# Patient Record
Sex: Female | Born: 2000 | Race: Black or African American | Hispanic: No | Marital: Single | Smoking: Never smoker
Health system: Southern US, Community
[De-identification: ages and names within clinical notes are randomized; demographics above are authoritative.]

## PROBLEM LIST (undated history)

## (undated) ENCOUNTER — Inpatient Hospital Stay (HOSPITAL_COMMUNITY): Payer: Self-pay

## (undated) DIAGNOSIS — J302 Other seasonal allergic rhinitis: Secondary | ICD-10-CM

## (undated) DIAGNOSIS — F909 Attention-deficit hyperactivity disorder, unspecified type: Secondary | ICD-10-CM

## (undated) HISTORY — PX: NO PAST SURGERIES: SHX2092

---
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
## 2022-05-10 IMAGING — CT CT MAXILLOFACIAL W/O CM
3 of 6 series · 15 of 47 positions shown, 18 images · non-contrast
Comparison: None.

CLINICAL DATA: 19-year-old female with facial swelling and
cellulitis.

EXAM:
CT MAXILLOFACIAL WITHOUT CONTRAST
TECHNIQUE: Multidetector CT imaging of the maxillofacial structures was
performed. Multiplanar CT image reconstructions were also generated.

[Series 3: maxilllofacial 2.0 hr40 3 · axial · 0.37mm/px · z∈[-134,+10]mm · 10 of 85 slices shown, 13 images]
[im 7/85  brain]
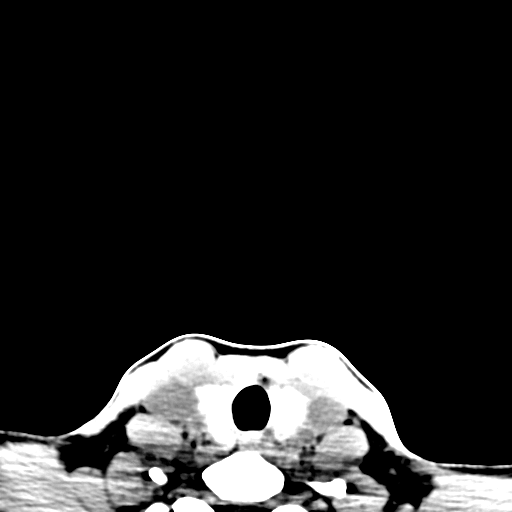
[im 7/85  bone]
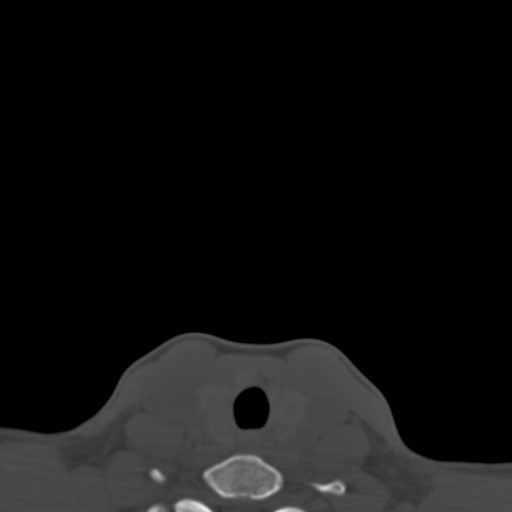
[im 13/85  bone]
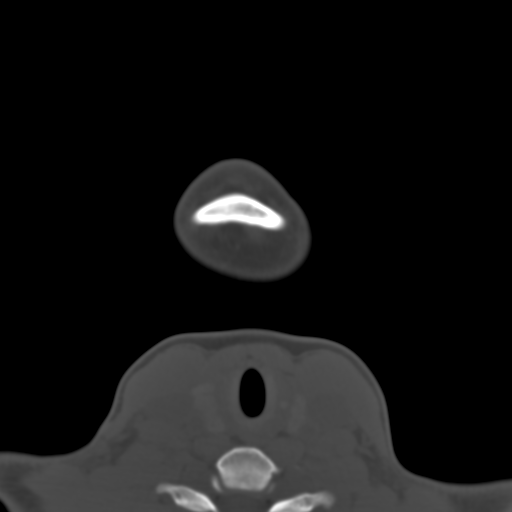
[im 25/85  bone]
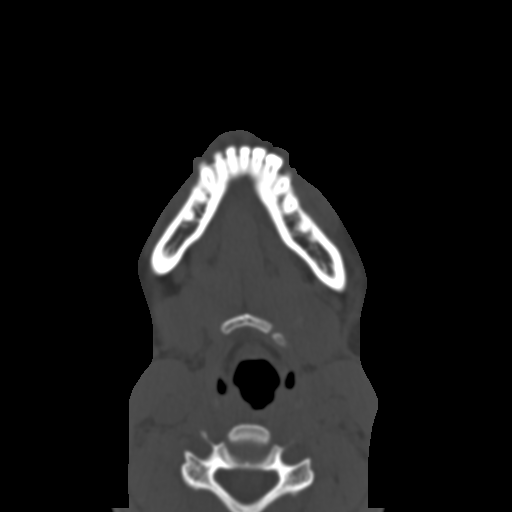
[im 31/85  bone]
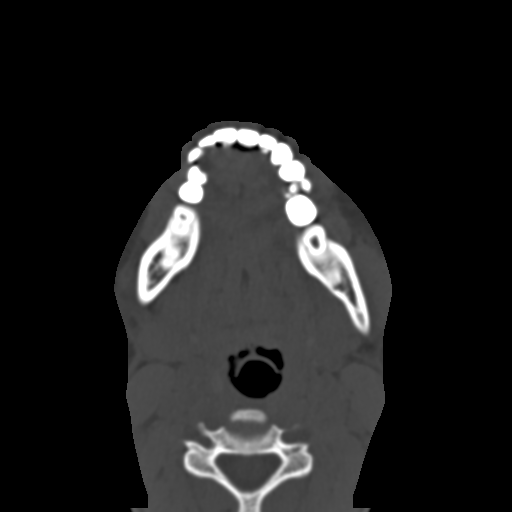
[im 37/85  brain]
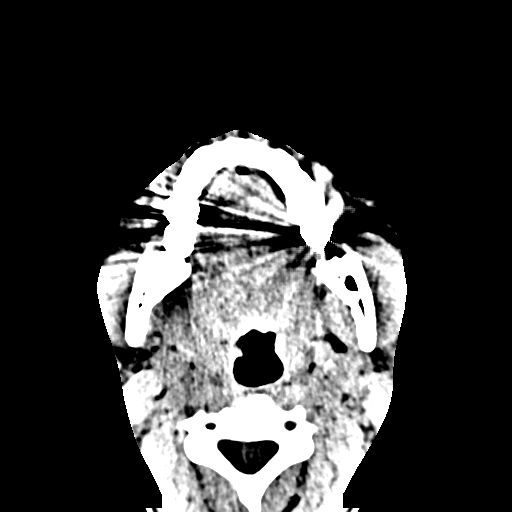
[im 37/85  bone]
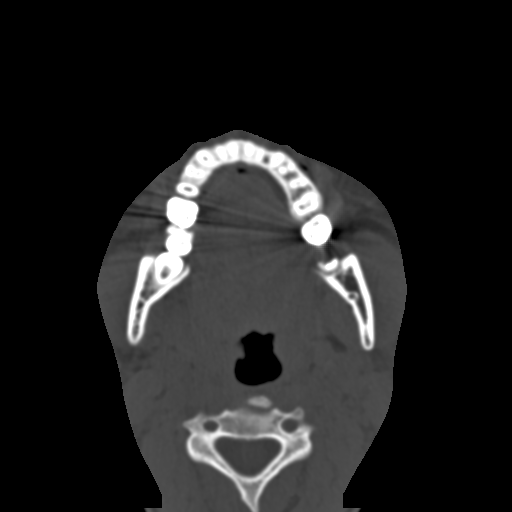
[im 49/85  bone]
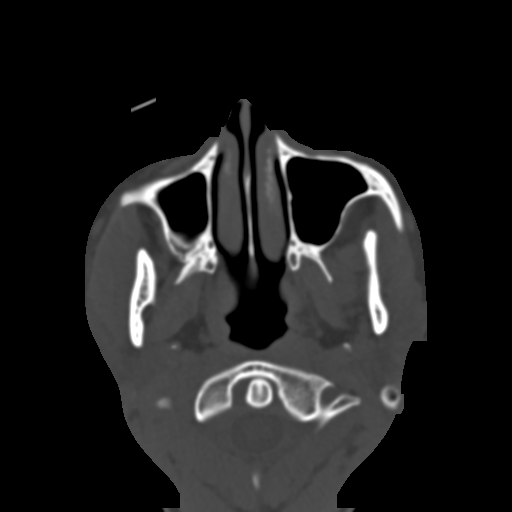
[im 55/85  bone]
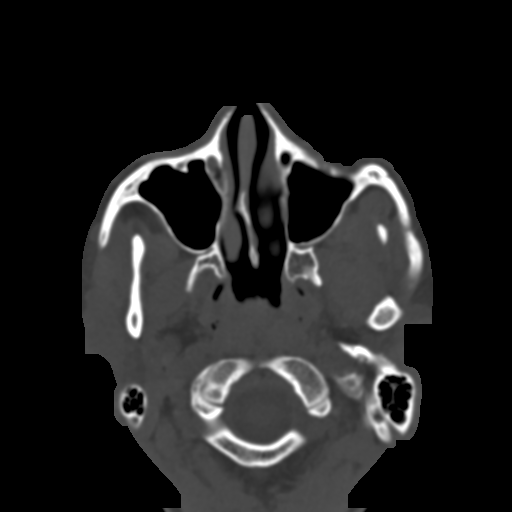
[im 61/85  bone]
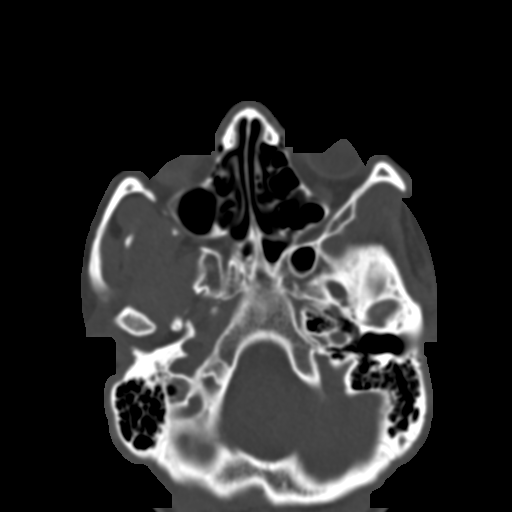
[im 73/85  brain]
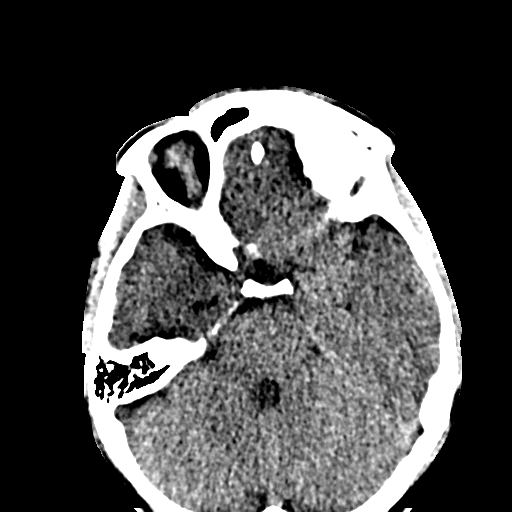
[im 73/85  bone]
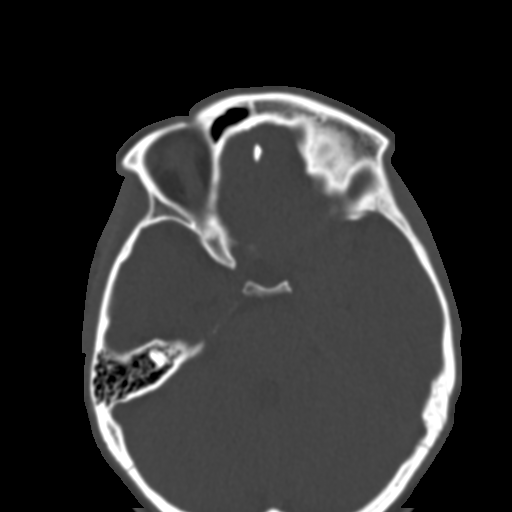
[im 79/85  bone]
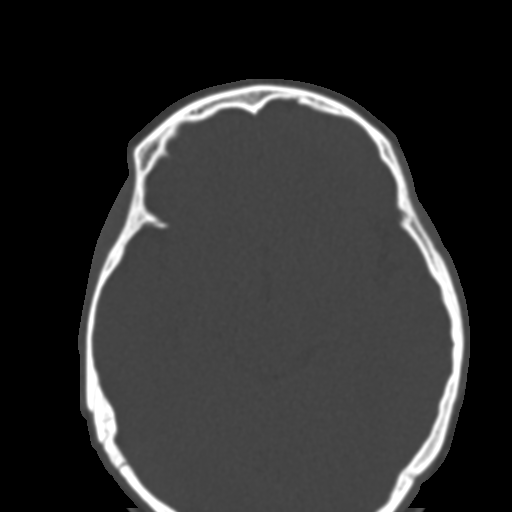

[Series 8: st sag · sagittal · 0.36mm/px · 2 of 102 slices shown]
[im 34/102  bone]
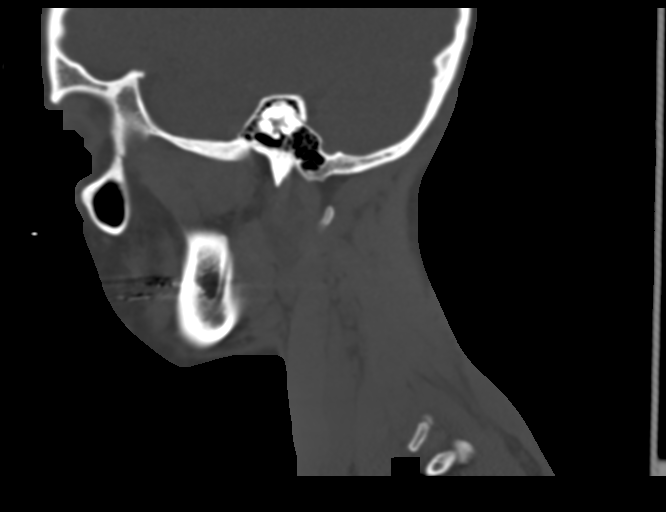
[im 68/102  bone]
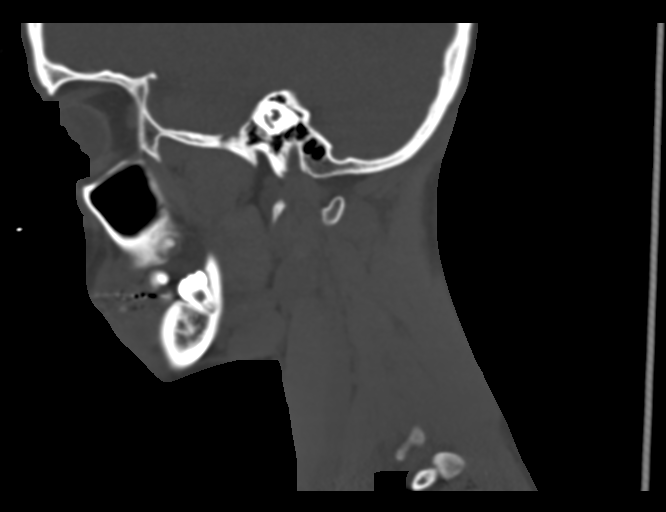

[Series 9: bone cor · coronal · 0.36mm/px · 3 of 122 slices shown]
[im 31/122  bone]
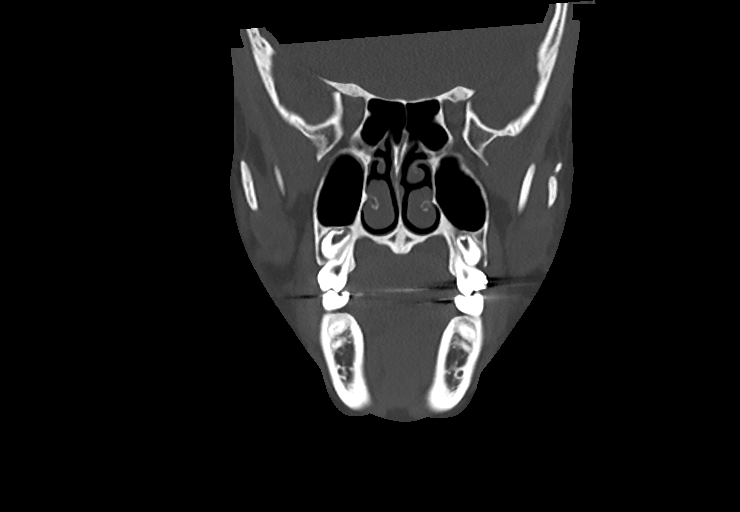
[im 61/122  bone]
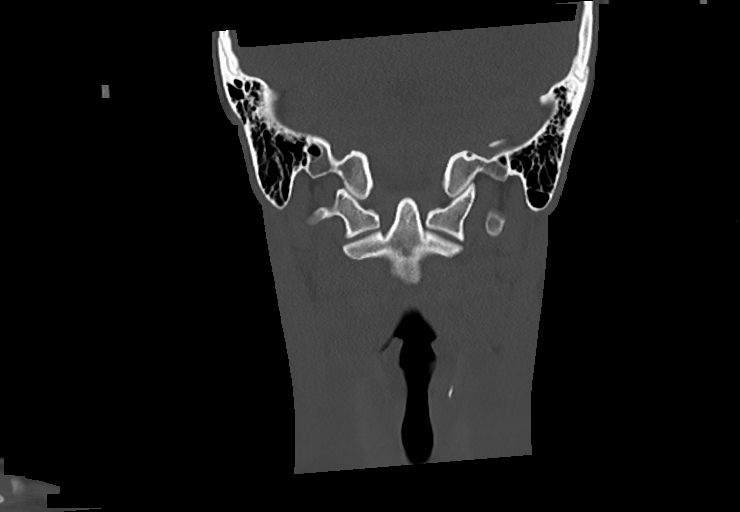
[im 91/122  bone]
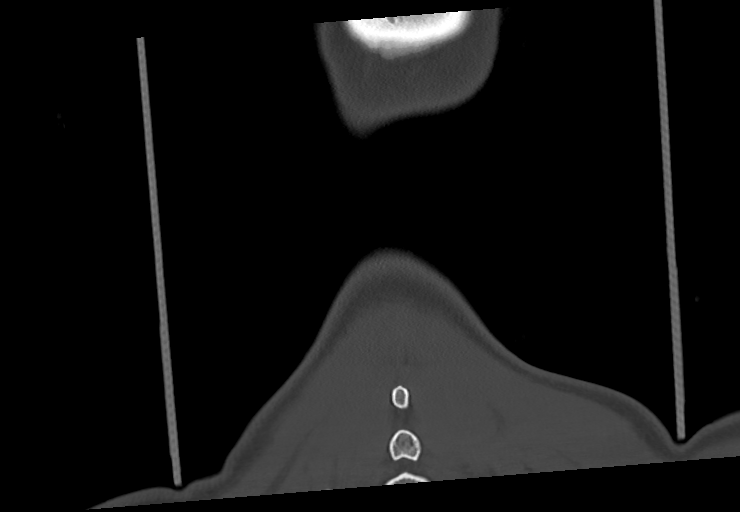

[15 of 47 positions shown; findings below may reference images not displayed]

FINDINGS: Osseous: No fracture or mandibular dislocation. No destructive
process.

Orbits: Negative. No traumatic or inflammatory finding.

Sinuses: Clear.

Soft tissues: Negative.

Limited intracranial: No significant or unexpected finding.
IMPRESSION: Normal maxillofacial CT.

## 2022-11-29 ENCOUNTER — Other Ambulatory Visit (HOSPITAL_COMMUNITY)
Admission: RE | Admit: 2022-11-29 | Discharge: 2022-11-29 | Disposition: A | Discharge status: Home / Self Care | Payer: PPO | Source: Ambulatory Visit | Attending: Advanced Practice Midwife | Admitting: Advanced Practice Midwife

## 2022-11-29 ENCOUNTER — Ambulatory Visit: Payer: PPO | Admitting: Advanced Practice Midwife

## 2022-11-29 ENCOUNTER — Telehealth: Payer: Self-pay

## 2022-11-29 ENCOUNTER — Encounter: Payer: Self-pay | Admitting: Advanced Practice Midwife

## 2022-11-29 VITALS — BP 106/66 | HR 74 | Ht 63.0 in | Wt 135.0 lb

## 2022-11-29 DIAGNOSIS — N898 Other specified noninflammatory disorders of vagina: Secondary | ICD-10-CM | POA: Insufficient documentation

## 2022-11-29 DIAGNOSIS — Z113 Encounter for screening for infections with a predominantly sexual mode of transmission: Secondary | ICD-10-CM | POA: Diagnosis not present

## 2022-11-29 NOTE — Progress Notes (Signed)
New Teen GYN here for a problem visit today   Possible BV.  LMP:11/24/2022  CC: Vaginal discharge went to urgent care had vaginal exam no swab was told to get probiotic over the counter.  Fun Fact: Pursing a career in [employer] of [employer]. 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

## 2022-11-29 NOTE — Telephone Encounter (Signed)
Called pt to notify of today's appointment. Pt understood day/time.

## 2022-11-30 DIAGNOSIS — N898 Other specified noninflammatory disorders of vagina: Secondary | ICD-10-CM | POA: Insufficient documentation

## 2022-11-30 LAB — HIV ANTIBODY (ROUTINE TESTING W REFLEX): HIV Screen 4th Generation wRfx: NONREACTIVE

## 2022-11-30 LAB — HEPATITIS B SURFACE ANTIGEN: Hepatitis B Surface Ag: NEGATIVE

## 2022-11-30 LAB — HEPATITIS C ANTIBODY: Hep C Virus Ab: NONREACTIVE

## 2022-11-30 LAB — RPR: RPR Ser Ql: NONREACTIVE

## 2022-11-30 NOTE — Progress Notes (Signed)
GYNECOLOGY PROBLEM VISIT NOTE  History:     Sandra Sullivan is a 22 y.o. G0P0 female here for gynecologic problem exam.  Current complaints: abnormal vaginal discharge. She denies vulvar or vaginal itching. Not currently sexually active.  Denies abnormal vaginal bleeding, discharge, pelvic pain, problems with intercourse or other gynecologic concerns.   Patient states she presented to Urgent Care for evaluation of her vaginal discharge. She states she did not have a swab collected and did not have any level of assessment of her perineum. She states she was told she had Bacterial Vaginosis and advised to purchase OTC probiotic supplements.    Gynecologic History Patient's last menstrual period was 11/24/2022. Contraception: abstinence and condoms Last Pap: N/A age 70.   Obstetric History OB History  No obstetric history on file.    Past Medical History:  Diagnosis Date   ADHD (attention deficit hyperactivity disorder)    Seasonal allergies     History reviewed. No pertinent surgical history.  Current Outpatient Medications on File Prior to Visit  Medication Sig Dispense Refill   cephALEXin (KEFLEX) 250 MG capsule Take 2 capsules (500 mg total) by mouth 2 (two) times daily. 28 capsule 0   fluticasone (FLONASE) 50 MCG/ACT nasal spray Place 2 sprays into both nostrils daily. 16 g 0   gabapentin (NEURONTIN) 300 MG capsule Take 1 capsule every night for 1 week then 1 capsule twice daily 60 capsule 1   predniSONE (DELTASONE) 20 MG tablet Take 1 tablet daily for 3 days in a.m. 3 tablet 0   No current facility-administered medications on file prior to visit.    No Known Allergies  Social History:  reports that she has never smoked. She has never used smokeless tobacco. She reports that she does not drink alcohol and does not use drugs.  Family History  Problem Relation Age of Onset   Migraines Mother    Anxiety disorder Maternal Aunt    Depression Maternal Aunt    Autism  Neg Hx    ADD / ADHD Neg Hx    Bipolar disorder Neg Hx     The following portions of the patient's history were reviewed and updated as appropriate: allergies, current medications, past family history, past medical history, past social history, past surgical history and problem list.  Review of Systems Pertinent items noted in HPI and remainder of comprehensive ROS otherwise negative.  Physical Exam:  BP 106/66   Pulse 74   Ht 5\' 3"  (1.6 m)   Wt 135 lb (61.2 kg)   LMP 11/24/2022   BMI 23.91 kg/m  CONSTITUTIONAL: Well-developed, well-nourished female in no acute distress.  HENT:  Normocephalic, atraumatic, External right and left ear normal.  EYES: Conjunctivae and EOM are normal. Pupils are equal, round, and reactive to light. No scleral icterus.  SKIN: Skin is warm and dry. No rash noted. Not diaphoretic. No erythema. No pallor. MUSCULOSKELETAL: Normal range of motion. No tenderness.  No cyanosis, clubbing, or edema. NEUROLOGIC: Alert and oriented to person, place, and time. Normal reflexes, muscle tone coordination.  PSYCHIATRIC: Normal mood and affect. Normal behavior. Normal judgment and thought content. CARDIOVASCULAR: Normal heart rate noted, regular rhythm RESPIRATORY: Normal RR, normal WOB ABDOMEN: Soft, no distention noted.  No tenderness, rebound or guarding.  PELVIC: Normal appearing external genitalia and urethral meatus; No abnormal vaginal discharge noted at introitus. No foul smell noted. CV swab collected via blind swab. Performed in the presence of a chaperone.   Assessment and Plan:  1. Vaginal discharge - Given age, lack of acute symptoms, lack of high risk behavior, speculum exam not performed, bimanual not indicated - Cervicovaginal ancillary only( [facility])  2. Screening examination for STD (sexually transmitted disease)  - Hepatitis B surface antigen - Hepatitis C antibody - RPR - HIV Antibody (routine testing w rflx)  Will follow up results of CV  swab and manage accordingly. Routine preventative health maintenance measures emphasized. Please refer to After Visit Summary for other counseling recommendations.      Clayton Bibles, MSA, MSN, CNM Certified Nurse Midwife, Biochemist, clinical for [employer], [facility]

## 2022-12-03 LAB — CERVICOVAGINAL ANCILLARY ONLY
Bacterial Vaginitis (gardnerella): NEGATIVE
Candida Glabrata: NEGATIVE
Candida Vaginitis: NEGATIVE
Chlamydia: NEGATIVE
Comment: NEGATIVE
Comment: NEGATIVE
Comment: NEGATIVE
Comment: NEGATIVE
Comment: NEGATIVE
Comment: NORMAL
Neisseria Gonorrhea: NEGATIVE
Trichomonas: NEGATIVE

## 2023-02-20 ENCOUNTER — Telehealth: Payer: PPO | Admitting: Physician Assistant

## 2023-02-20 DIAGNOSIS — Z20822 Contact with and (suspected) exposure to covid-19: Secondary | ICD-10-CM

## 2023-02-20 NOTE — Progress Notes (Signed)
   Thank you for the details you included in the comment boxes. Those details are very helpful in determining the best course of treatment for you and help [place] to provide the best care.Because of your shortness of breath and fever, we recommend that you convert this visit to a video visit in order for the provider to better assess what is going on.  The provider will be able to give you a more accurate diagnosis and treatment plan if we can more freely discuss your symptoms and with the addition of a virtual examination.   If you convert to a video visit, we will bill your insurance (similar to an office visit) and you will not be charged for this e-Visit. You will be able to stay at home and speak with the first available [facility] advanced practice provider. The link to do a video visit is in the drop down Menu tab of your Welcome screen in MyChart.   I have spent 5 minutes in review of e-visit questionnaire, review and updating patient chart, medical decision making and response to patient.   Gilberto Better, PA-C

## 2023-05-03 ENCOUNTER — Other Ambulatory Visit (HOSPITAL_COMMUNITY)
Admission: RE | Admit: 2023-05-03 | Discharge: 2023-05-03 | Disposition: A | Discharge status: Home / Self Care | Payer: Medicaid Other | Source: Ambulatory Visit | Attending: Family Medicine | Admitting: Family Medicine

## 2023-05-03 ENCOUNTER — Other Ambulatory Visit (INDEPENDENT_AMBULATORY_CARE_PROVIDER_SITE_OTHER): Payer: Medicaid Other

## 2023-05-03 ENCOUNTER — Ambulatory Visit: Payer: Medicaid Other | Admitting: *Deleted

## 2023-05-03 VITALS — BP 129/83 | HR 78 | Wt 137.0 lb

## 2023-05-03 DIAGNOSIS — Z3A10 10 weeks gestation of pregnancy: Secondary | ICD-10-CM | POA: Diagnosis not present

## 2023-05-03 DIAGNOSIS — Z3402 Encounter for supervision of normal first pregnancy, second trimester: Secondary | ICD-10-CM | POA: Insufficient documentation

## 2023-05-03 DIAGNOSIS — Z3401 Encounter for supervision of normal first pregnancy, first trimester: Secondary | ICD-10-CM | POA: Diagnosis not present

## 2023-05-03 DIAGNOSIS — O3680X0 Pregnancy with inconclusive fetal viability, not applicable or unspecified: Secondary | ICD-10-CM

## 2023-05-03 DIAGNOSIS — Z3400 Encounter for supervision of normal first pregnancy, unspecified trimester: Secondary | ICD-10-CM

## 2023-05-03 DIAGNOSIS — O0990 Supervision of high risk pregnancy, unspecified, unspecified trimester: Secondary | ICD-10-CM | POA: Insufficient documentation

## 2023-05-03 NOTE — Progress Notes (Signed)
New OB Intake  I explained I am completing New OB Intake today. We discussed EDD of 11/29/2023, by Ultrasound. Pt is G1P0. I reviewed her allergies, medications and Medical/Surgical/OB history.    Patient Active Problem List   Diagnosis Date Noted   Encounter for supervision of normal first pregnancy in first trimester 05/03/2023   [redacted] weeks gestation of pregnancy 05/03/2023    Concerns addressed today  Patient informed that the ultrasound is considered a limited obstetric ultrasound and is not intended to be a complete ultrasound exam.  Patient also informed that the ultrasound is not being completed with the intent of assessing for fetal or placental anomalies or any pelvic abnormalities. Explained that the purpose of today's ultrasound is to assess for viability.  Patient acknowledges the purpose of the exam and the limitations of the study.     Delivery Plans Plans to deliver at [facility]. Discussed the nature of our practice with multiple providers including residents and students. Due to the size of the practice, the delivering provider may not be the same as those providing prenatal care.   MyChart/Babyscripts MyChart access verified. I explained pt will have some visits in office and some virtually. Babyscripts app discussed and ordered.   Blood Pressure Cuff Blood pressure cuff discussed and given. Discussed to be used for virtual visits and or if needed BP checks weekly.  Anatomy US Explained first scheduled [place] will be around 19 weeks.   Last Pap NA  First visit review I reviewed new OB appt with patient. Explained pt will be seen by Dr Chester Holstein at first visit. Discussed Avelina Laine genetic screening with patient to be collected at [facility]. Routine prenatal labs collected today.    Scheryl Marten, RN 05/03/2023  11:22 AM

## 2023-05-04 LAB — CBC/D/PLT+RPR+RH+ABO+RUBIGG...
Antibody Screen: NEGATIVE
Basophils Absolute: 0 10*3/uL (ref 0.0–0.2)
Basos: 0 %
EOS (ABSOLUTE): 0.1 10*3/uL (ref 0.0–0.4)
Eos: 1 %
HCV Ab: NONREACTIVE
HIV Screen 4th Generation wRfx: NONREACTIVE
Hematocrit: 37 % (ref 34.0–46.6)
Hemoglobin: 11.9 g/dL (ref 11.1–15.9)
Hepatitis B Surface Ag: NEGATIVE
Immature Grans (Abs): 0 10*3/uL (ref 0.0–0.1)
Immature Granulocytes: 0 %
Lymphocytes Absolute: 1.5 10*3/uL (ref 0.7–3.1)
Lymphs: 18 %
MCH: 30.5 pg (ref 26.6–33.0)
MCHC: 32.2 g/dL (ref 31.5–35.7)
MCV: 95 fL (ref 79–97)
Monocytes Absolute: 0.8 10*3/uL (ref 0.1–0.9)
Monocytes: 9 %
Neutrophils Absolute: 6.3 10*3/uL (ref 1.4–7.0)
Neutrophils: 72 %
Platelets: 226 10*3/uL (ref 150–450)
RBC: 3.9 x10E6/uL (ref 3.77–5.28)
RDW: 13.5 % (ref 11.7–15.4)
RPR Ser Ql: NONREACTIVE
Rh Factor: POSITIVE
Rubella Antibodies, IGG: 2.73 {index} (ref >0.99)
WBC: 8.7 10*3/uL (ref 3.4–10.8)

## 2023-05-04 LAB — HCV INTERPRETATION

## 2023-05-06 LAB — CERVICOVAGINAL ANCILLARY ONLY
Chlamydia: NEGATIVE
Comment: NEGATIVE
Comment: NORMAL
Neisseria Gonorrhea: NEGATIVE

## 2023-05-07 ENCOUNTER — Other Ambulatory Visit: Payer: Self-pay | Admitting: Obstetrics & Gynecology

## 2023-05-07 DIAGNOSIS — B951 Streptococcus, group B, as the cause of diseases classified elsewhere: Secondary | ICD-10-CM | POA: Insufficient documentation

## 2023-05-07 DIAGNOSIS — Z3401 Encounter for supervision of normal first pregnancy, first trimester: Secondary | ICD-10-CM

## 2023-05-07 LAB — URINE CULTURE, OB REFLEX

## 2023-05-07 LAB — CULTURE, OB URINE

## 2023-05-07 MED ORDER — CEFADROXIL 500 MG PO CAPS: 500.0000 mg | ORAL_CAPSULE | Freq: Two times a day (BID) | ORAL | 0 refills | Status: DC

## 2023-05-21 ENCOUNTER — Ambulatory Visit: Payer: Medicaid Other | Admitting: Obstetrics & Gynecology

## 2023-05-21 ENCOUNTER — Encounter: Payer: Self-pay | Admitting: Obstetrics & Gynecology

## 2023-05-21 VITALS — BP 114/66 | HR 91 | Wt 134.0 lb

## 2023-05-21 DIAGNOSIS — Z1339 Encounter for screening examination for other mental health and behavioral disorders: Secondary | ICD-10-CM

## 2023-05-21 DIAGNOSIS — O2340 Unspecified infection of urinary tract in pregnancy, unspecified trimester: Secondary | ICD-10-CM | POA: Diagnosis not present

## 2023-05-21 DIAGNOSIS — Z3A12 12 weeks gestation of pregnancy: Secondary | ICD-10-CM | POA: Diagnosis not present

## 2023-05-21 DIAGNOSIS — B951 Streptococcus, group B, as the cause of diseases classified elsewhere: Secondary | ICD-10-CM

## 2023-05-21 DIAGNOSIS — Z3400 Encounter for supervision of normal first pregnancy, unspecified trimester: Secondary | ICD-10-CM

## 2023-05-21 MED ORDER — ASPIRIN 81 MG PO TBEC: 81.0000 mg | DELAYED_RELEASE_TABLET | Freq: Every day | ORAL | 2 refills | Status: DC

## 2023-05-21 NOTE — Progress Notes (Signed)
NOB   Labs already drawn.  Genetics: Yes   Concerns about recent UTI .when to do TOC urine.

## 2023-05-21 NOTE — Progress Notes (Signed)
History:   Sandra Sullivan is a 22 y.o. G1P0 at [redacted]w[redacted]d by early ultrasound being seen today for her first obstetrical visit. Pregnancy history fully reviewed.  Patient reports no complaints.     HISTORY: OB History  Gravida Para Term Preterm AB Living  1 0 0 0 0 0  SAB IAB Ectopic Multiple Live Births  0 0 0 0 0    # Outcome Date GA Lbr Len/2nd Weight Sex Type Anes PTL Lv  1 Current             Past Medical History:  Diagnosis Date   ADHD (attention deficit hyperactivity disorder)    Seasonal allergies    History reviewed. No pertinent surgical history. Family History  Problem Relation Age of Onset   Migraines Mother    Anxiety disorder Maternal Aunt    Depression Maternal Aunt    Autism Neg Hx    ADD / ADHD Neg Hx    Bipolar disorder Neg Hx    Social History   Tobacco Use   Smoking status: Never   Smokeless tobacco: Never  Vaping Use   Vaping status: Never Used  Substance Use Topics   Alcohol use: No   Drug use: No   No Known Allergies Current Outpatient Medications on File Prior to Visit  Medication Sig Dispense Refill   Prenatal Vit-Fe Fumarate-FA (MULTIVITAMIN-PRENATAL) 27-0.8 MG TABS tablet Take 1 tablet by mouth daily at 12 noon.     cefadroxil (DURICEF) 500 MG capsule Take 1 capsule (500 mg total) by mouth 2 (two) times daily. 14 capsule 0   No current facility-administered medications on file prior to visit.    Review of Systems Pertinent items noted in HPI and remainder of comprehensive ROS otherwise negative.  Indications for ASA therapy (per UpToDate) Two or more of the following: Nulliparity Yes Sociodemographic characteristics (African American race, low socioeconomic level) Yes   Physical Exam:   Vitals:   05/21/23 1547  BP: 114/66  Pulse: 91  Weight: 134 lb (60.8 kg)   Fetal Heart Rate (bpm): 158   General: well-developed, well-nourished female in no acute distress  Breasts:  deferred  Skin: normal coloration and turgor, no  rashes  Neurologic: oriented, normal, negative, normal mood  Extremities: normal strength, tone, and muscle mass, ROM of all joints is normal  HEENT PERRLA, extraocular movement intact and sclera clear, anicteric  Neck supple and no masses  Cardiovascular: regular rate and rhythm  Respiratory:  no respiratory distress, normal breath sounds  Abdomen: soft, non-tender; bowel sounds normal; no masses,  no organomegaly  Pelvic: deferred    Assessment:    Pregnancy: G1P0 Patient Active Problem List   Diagnosis Date Noted   Group B Streptococcus urinary tract infection affecting pregnancy, antepartum 05/07/2023   Encounter for supervision of normal first pregnancy in first trimester 05/03/2023     Plan:    1. Group B Streptococcus urinary tract infection affecting pregnancy, antepartum Treated recently, TOC urine culture to be done next visit.  2. [redacted] weeks gestation of pregnancy 3. Supervision of normal first pregnancy, antepartum - PANORAMA PRENATAL TEST - HORIZON Basic Panel - Hemoglobin A1c - aspirin EC 81 MG tablet; Take 1 tablet (81 mg total) by mouth at bedtime. Start taking when you are [redacted] weeks pregnant for rest of pregnancy for prevention of preeclampsia  Dispense: 300 tablet; Refill: 2 - [place] MFM OB COMP + 14 WK; Future  Initial labs reviewed, all reassuring.  Done during intake  visit. Continue prenatal vitamins. Problem list reviewed and updated. Genetic Screening discussed, Panorama and Horizon: ordered. Ultrasound discussed; fetal anatomic survey: scheduled. Anticipatory guidance about prenatal visits given including labs, ultrasounds, and testing. Weight gain recommendations per IOM guidelines reviewed: underweight/BMI 18.5 or less > 28 - 40 lbs; normal weight/BMI 18.5 - 24.9 > 25 - 35 lbs; overweight/BMI 25 - 29.9 > 15 - 25 lbs; obese/BMI  30 or more > 11 - 20 lbs. Discussed usage of the Babyscripts app for more information about pregnancy, and to track blood  pressures. Also discussed usage of virtual visits as additional source of managing and completing prenatal visits.  Patient was encouraged to use MyChart to review results, send requests, and have questions addressed.   The nature of [facility] for [facility] with multiple MDs and Advanced Practice Providers was explained to patient; also emphasized that residents, students are part of our team. Routine obstetric precautions reviewed. Encouraged to seek out care at our office or emergency room [facility] MAU preferred) for urgent and/or emergent concerns. Return in about 4 weeks (around 06/18/2023) for OFFICE OB VISIT (MD only).     Jaynie Collins, MD, FACOG Obstetrician & Gynecologist, [facility] for [employer], [facility]

## 2023-05-22 LAB — HEMOGLOBIN A1C
Est. average glucose Bld gHb Est-mCnc: 108 mg/dL
Hgb A1c MFr Bld: 5.4 % (ref 4.8–5.6)

## 2023-05-26 LAB — PANORAMA PRENATAL TEST FULL PANEL:PANORAMA TEST PLUS 5 ADDITIONAL MICRODELETIONS: FETAL FRACTION: 17.1

## 2023-06-04 ENCOUNTER — Encounter: Payer: Medicaid Other | Admitting: Obstetrics & Gynecology

## 2023-06-04 LAB — HORIZON CUSTOM: REPORT SUMMARY: NEGATIVE

## 2023-06-20 ENCOUNTER — Telehealth: Payer: Self-pay

## 2023-06-20 ENCOUNTER — Encounter: Payer: Medicaid Other | Admitting: Obstetrics & Gynecology

## 2023-06-20 NOTE — Telephone Encounter (Signed)
2 attempts to call patient for virtual visit with provider

## 2023-06-23 ENCOUNTER — Inpatient Hospital Stay (HOSPITAL_COMMUNITY)
Admission: AD | Admit: 2023-06-23 | Discharge: 2023-06-23 | Disposition: A | Discharge status: Home / Self Care | Payer: Medicaid Other | Attending: Obstetrics & Gynecology | Admitting: Obstetrics & Gynecology

## 2023-06-23 ENCOUNTER — Encounter (HOSPITAL_COMMUNITY): Payer: Self-pay | Admitting: Obstetrics & Gynecology

## 2023-06-23 DIAGNOSIS — R109 Unspecified abdominal pain: Secondary | ICD-10-CM

## 2023-06-23 DIAGNOSIS — R1030 Lower abdominal pain, unspecified: Secondary | ICD-10-CM | POA: Insufficient documentation

## 2023-06-23 DIAGNOSIS — B951 Streptococcus, group B, as the cause of diseases classified elsewhere: Secondary | ICD-10-CM | POA: Diagnosis not present

## 2023-06-23 DIAGNOSIS — M62830 Muscle spasm of back: Secondary | ICD-10-CM | POA: Diagnosis present

## 2023-06-23 DIAGNOSIS — N949 Unspecified condition associated with female genital organs and menstrual cycle: Secondary | ICD-10-CM | POA: Diagnosis not present

## 2023-06-23 DIAGNOSIS — R8271 Bacteriuria: Secondary | ICD-10-CM | POA: Diagnosis not present

## 2023-06-23 DIAGNOSIS — M549 Dorsalgia, unspecified: Secondary | ICD-10-CM | POA: Diagnosis not present

## 2023-06-23 DIAGNOSIS — O26899 Other specified pregnancy related conditions, unspecified trimester: Secondary | ICD-10-CM

## 2023-06-23 DIAGNOSIS — O98812 Other maternal infectious and parasitic diseases complicating pregnancy, second trimester: Secondary | ICD-10-CM | POA: Insufficient documentation

## 2023-06-23 DIAGNOSIS — R102 Pelvic and perineal pain: Secondary | ICD-10-CM | POA: Diagnosis not present

## 2023-06-23 DIAGNOSIS — O26892 Other specified pregnancy related conditions, second trimester: Secondary | ICD-10-CM | POA: Insufficient documentation

## 2023-06-23 DIAGNOSIS — Z3A17 17 weeks gestation of pregnancy: Secondary | ICD-10-CM | POA: Diagnosis not present

## 2023-06-23 DIAGNOSIS — O99891 Other specified diseases and conditions complicating pregnancy: Secondary | ICD-10-CM | POA: Diagnosis not present

## 2023-06-23 LAB — URINALYSIS, ROUTINE W REFLEX MICROSCOPIC
Bilirubin Urine: NEGATIVE
Glucose, UA: NEGATIVE mg/dL
Hgb urine dipstick: NEGATIVE
Ketones, ur: 5 mg/dL — AB
Nitrite: NEGATIVE
Protein, ur: NEGATIVE mg/dL
Specific Gravity, Urine: 1.02 (ref 1.005–1.030)
pH: 6 (ref 5.0–8.0)

## 2023-06-23 LAB — WET PREP, GENITAL
Clue Cells Wet Prep HPF POC: NONE SEEN
Sperm: NONE SEEN
Trich, Wet Prep: NONE SEEN
WBC, Wet Prep HPF POC: <10 (ref <10)
Yeast Wet Prep HPF POC: NONE SEEN

## 2023-06-23 MED ORDER — ACETAMINOPHEN 500 MG PO TABS
1000.0000 mg | ORAL_TABLET | Freq: Once | ORAL | Status: AC
Start: 2023-06-23 — End: 2023-06-23
  Administered 2023-06-23: 1000 mg via ORAL
  Filled 2023-06-23: qty 2

## 2023-06-23 MED ORDER — CYCLOBENZAPRINE HCL 10 MG PO TABS: 10.0000 mg | ORAL_TABLET | Freq: Two times a day (BID) | ORAL | 0 refills | Status: DC | PRN

## 2023-06-23 NOTE — MAU Provider Note (Signed)
History     CSN: [id number]  Arrival date and time: 06/23/23 1237   Event Date/Time   First Provider Initiated Contact with Patient 06/23/23 1403      Chief Complaint  Patient presents with   Abdominal Pain   Back Pain   HPI Sandra Sullivan is a 22 y.o. year old G1P0 female at [redacted]w[redacted]d weeks gestation who presents to MAU reporting lower abdominal pain since she was around 15-16 weeks that is worsening; rated 7/10. She describes the pain as "sore or scratching from the inside." She states that she is unsure if it is FM or not.She also reports lower back pain; rated 8/10. She has not taken anything for the pain. She reports the pain goes away when she lays down. She denies VB, LOF or abnormal vaginal d/c. Her pregnancy is complicated by (+) GBS bacteriuria for which she states she was completed abx therapy for. She receives [facility] with [facility]; next appt is 07/18/2023.   OB History     Gravida  1   Para      Term      Preterm      AB      Living         SAB      IAB      Ectopic      Multiple      Live Births              Past Medical History:  Diagnosis Date   ADHD (attention deficit hyperactivity disorder)    Seasonal allergies     Past Surgical History:  Procedure Laterality Date   NO PAST SURGERIES      Family History  Problem Relation Age of Onset   Migraines Mother    Anxiety disorder Maternal Aunt    Depression Maternal Aunt    Autism Neg Hx    ADD / ADHD Neg Hx    Bipolar disorder Neg Hx     Social History   Tobacco Use   Smoking status: Never   Smokeless tobacco: Never  Vaping Use   Vaping status: Never Used  Substance Use Topics   Alcohol use: No   Drug use: No    Allergies: No Known Allergies  Medications Prior to Admission  Medication Sig Dispense Refill Last Dose/Taking   Prenatal Vit-Fe Fumarate-FA (MULTIVITAMIN-PRENATAL) 27-0.8 MG TABS tablet Take 1 tablet by mouth daily at 12 noon.   06/23/2023   aspirin EC  81 MG tablet Take 1 tablet (81 mg total) by mouth at bedtime. Start taking when you are [redacted] weeks pregnant for rest of pregnancy for prevention of preeclampsia 300 tablet 2    cefadroxil (DURICEF) 500 MG capsule Take 1 capsule (500 mg total) by mouth 2 (two) times daily. 14 capsule 0     Review of Systems  Constitutional: Negative.   HENT: Negative.    Eyes: Negative.   Respiratory: Negative.    Cardiovascular: Negative.   Gastrointestinal: Negative.   Endocrine: Negative.   Genitourinary:  Positive for pelvic pain ("sore or scratching from inside feeling").  Musculoskeletal:  Positive for back pain.  Skin: Negative.   Allergic/Immunologic: Negative.   Neurological: Negative.   Hematological: Negative.   Psychiatric/Behavioral: Negative.     Physical Exam   Blood pressure 119/63, pulse 81, temperature 98.1 F (36.7 C), temperature source Oral, resp. rate 18, height 5\' 3"  (1.6 m), weight 63.8 kg, last menstrual period 02/05/2023, SpO2 100%.  Physical Exam  Vitals and nursing note reviewed.  Constitutional:      Appearance: Normal appearance. She is normal weight.  Cardiovascular:     Rate and Rhythm: Normal rate.  Pulmonary:     Effort: Pulmonary effort is normal.  Abdominal:     Palpations: Abdomen is soft.     Tenderness: There is abdominal tenderness (very mild with deep palpation).  Genitourinary:    Comments: Swabs collected by patient using blind swab technique  Musculoskeletal:        General: Normal range of motion.  Skin:    General: Skin is warm and dry.  Neurological:     Mental Status: She is alert and oriented to person, place, and time.  Psychiatric:        Mood and Affect: Mood normal.        Behavior: Behavior normal.        Thought Content: Thought content normal.        Judgment: Judgment normal.    FHTs by doppler: 141 bpm   Reassessment @ 1454: Patient reports pain is "better" rating now 6/10.  MAU Course  Procedures  MDM CCUA UCx --  Results pending  Could not offer Flexeril d/t driving self to MAU Tylenol 1000 mg po  Warm Compress to affected area  Results for orders placed or performed during the hospital encounter of 06/23/23 (from the past 24 hours)  Urinalysis, Routine w reflex microscopic -Urine, Clean Catch     Status: Abnormal   Collection Time: 06/23/23 12:57 PM  Result Value Ref Range   Color, Urine YELLOW YELLOW   APPearance HAZY (A) CLEAR   Specific Gravity, Urine 1.020 1.005 - 1.030   pH 6.0 5.0 - 8.0   Glucose, UA NEGATIVE NEGATIVE mg/dL   Hgb urine dipstick NEGATIVE NEGATIVE   Bilirubin Urine NEGATIVE NEGATIVE   Ketones, ur 5 (A) NEGATIVE mg/dL   Protein, ur NEGATIVE NEGATIVE mg/dL   Nitrite NEGATIVE NEGATIVE   Leukocytes,Ua SMALL (A) NEGATIVE   RBC / HPF 0-5 0 - 5 RBC/hpf   WBC, UA 0-5 0 - 5 WBC/hpf   Bacteria, UA RARE (A) NONE SEEN   Squamous Epithelial / HPF 11-20 0 - 5 /HPF   Mucus PRESENT   Wet prep, genital     Status: None   Collection Time: 06/23/23  2:23 PM  Result Value Ref Range   Yeast Wet Prep HPF POC NONE SEEN NONE SEEN   Trich, Wet Prep NONE SEEN NONE SEEN   Clue Cells Wet Prep HPF POC NONE SEEN NONE SEEN   WBC, Wet Prep HPF POC <10 <10   Sperm NONE SEEN     Assessment and Plan  1. Abdominal pain during pregnancy in second trimester (Primary) - Information provided on abdominal pain in pregnancy - Advised that if abdominal pain is not relieved with Flexeril and Tylenol, she should seek care in MAU   2. Back pain affecting pregnancy in second trimester - Information provided on back pain in pregnancy - Prescription for: Flexeril 10 mg po every 12 hr prn back pain   3. Pain of round ligament complicating pregnancy, antepartum - Information provided on RLP   4. GBS bacteriuria - TOC UCx sent -- Results pending  - Information provided on GBS in pregnancy  5. [redacted] weeks gestation of pregnancy   - Discharge patient - Keep scheduled appt with MFM for anatomy U/S on  07/05/2023 and [facility] for ROB on 07/18/2023 - Patient verbalized an understanding of the  plan of care and agrees.    Raelyn Mora, CNM 06/23/2023, 2:03 PM

## 2023-06-23 NOTE — Discharge Instructions (Signed)
Increase your water intake to at least 8-10 16.9 ounce bottles of water daily

## 2023-06-23 NOTE — MAU Note (Signed)
.  Sandra Sullivan is a 22 y.o. at 107w2d here in MAU reporting: she started having lower abdominal pain around 15-16 weeks of gestation that has gotten worse. The pain is described as "sore" and "scratching from the inside". She is also having lower back cramping. Her pain goes away when she is laying down in the bed. Has not attempted any medications. Denies VB, abnormal discharge, or LOF.   Onset of complaint: On-going Pain score: 7/10 abdomen, 8/10 back Vitals:   06/23/23 1257  BP: 109/62  Pulse: 71  Resp: 18  Temp: 98.1 F (36.7 C)  SpO2: 100%     FHT:141 bpm Lab orders placed from triage: UA

## 2023-06-25 ENCOUNTER — Encounter: Payer: Self-pay | Admitting: Obstetrics and Gynecology

## 2023-06-25 LAB — CULTURE, OB URINE

## 2023-06-25 LAB — GC/CHLAMYDIA PROBE AMP (~~LOC~~) NOT AT ARMC
Chlamydia: NEGATIVE
Comment: NEGATIVE
Comment: NORMAL
Neisseria Gonorrhea: NEGATIVE

## 2023-06-26 ENCOUNTER — Encounter: Payer: Self-pay | Admitting: Certified Nurse Midwife

## 2023-07-03 NOTE — L&D Delivery Note (Addendum)
 OB/GYN Faculty Practice Delivery Note  Sandra Sullivan is a 23 y.o. G1P1001 s/p SVD at [redacted]w[redacted]d. She was admitted for SOL.   ROM: 8h 30m with clear fluid GBS Status: positive and s/p adequate PCN   Maximum Maternal Temperature: 98.2F  Labor Progress: Initial SVE: 5/90/-2. She received AROM. She then progressed to complete.   Delivery Date/Time: 2142 5/20 Delivery: Called to room and patient was complete and pushing. Head delivered LOA. Nuchal cord present. Shoulder and body delivered in usual fashion. Nuchal easily reduced. Infant with spontaneous cry, placed on mother's abdomen, dried and stimulated. Cord clamped x 2 after 1-minute delay, and cut by [name]. Cord blood drawn. Placenta delivered spontaneously with gentle cord traction. Fundus firm with massage and Pitocin. Labia, perineum, vagina, and cervix inspected without laceration. Mom with hx of R femoral hernia.  Pt able to palpate along with provider and appears to be at baseline. Mom and baby doing well.  Baby Weight: pending  Placenta: 3 vessel, intact. Sent to L&D Complications: None Lacerations: None EBL: 60 mL Analgesia: Epidural   Infant:  APGAR (1 MIN): 9  APGAR (5 MINS): 9   Ebony Goldstein, MD [facility] Family Medicine Fellow, [facility] for [facility], [facility] 11/19/2023, 11:37 PM

## 2023-07-05 ENCOUNTER — Other Ambulatory Visit: Payer: Self-pay

## 2023-07-05 ENCOUNTER — Ambulatory Visit: Payer: Medicaid Other | Admitting: *Deleted

## 2023-07-05 ENCOUNTER — Ambulatory Visit: Payer: Medicaid Other | Attending: Obstetrics & Gynecology

## 2023-07-05 ENCOUNTER — Other Ambulatory Visit: Payer: Self-pay | Admitting: *Deleted

## 2023-07-05 ENCOUNTER — Encounter: Payer: Self-pay | Admitting: *Deleted

## 2023-07-05 VITALS — BP 106/61 | HR 63

## 2023-07-05 DIAGNOSIS — Z363 Encounter for antenatal screening for malformations: Secondary | ICD-10-CM | POA: Insufficient documentation

## 2023-07-05 DIAGNOSIS — Z3689 Encounter for other specified antenatal screening: Secondary | ICD-10-CM

## 2023-07-05 DIAGNOSIS — Z3A12 12 weeks gestation of pregnancy: Secondary | ICD-10-CM

## 2023-07-05 DIAGNOSIS — Z3A19 19 weeks gestation of pregnancy: Secondary | ICD-10-CM | POA: Insufficient documentation

## 2023-07-05 DIAGNOSIS — Z3402 Encounter for supervision of normal first pregnancy, second trimester: Secondary | ICD-10-CM | POA: Insufficient documentation

## 2023-07-05 DIAGNOSIS — Z3400 Encounter for supervision of normal first pregnancy, unspecified trimester: Secondary | ICD-10-CM

## 2023-07-05 DIAGNOSIS — Z362 Encounter for other antenatal screening follow-up: Secondary | ICD-10-CM

## 2023-07-18 ENCOUNTER — Encounter: Payer: Self-pay | Admitting: Obstetrics & Gynecology

## 2023-07-18 ENCOUNTER — Ambulatory Visit (INDEPENDENT_AMBULATORY_CARE_PROVIDER_SITE_OTHER): Payer: Medicaid Other | Admitting: Obstetrics & Gynecology

## 2023-07-18 VITALS — BP 137/69 | HR 97 | Wt 143.0 lb

## 2023-07-18 DIAGNOSIS — K4090 Unilateral inguinal hernia, without obstruction or gangrene, not specified as recurrent: Secondary | ICD-10-CM

## 2023-07-18 DIAGNOSIS — Z3A20 20 weeks gestation of pregnancy: Secondary | ICD-10-CM

## 2023-07-18 DIAGNOSIS — Z3402 Encounter for supervision of normal first pregnancy, second trimester: Secondary | ICD-10-CM

## 2023-07-18 HISTORY — DX: Unilateral inguinal hernia, without obstruction or gangrene, not specified as recurrent: K40.90

## 2023-07-18 NOTE — Progress Notes (Signed)
PRENATAL VISIT NOTE  Subjective:  Sandra Sullivan is a 23 y.o. G1P0 at [redacted]w[redacted]d being seen today for ongoing prenatal care.  She is currently monitored for the following issues for this low-risk pregnancy and has Encounter for supervision of normal first pregnancy in second trimester; Group B Streptococcus urinary tract infection affecting pregnancy, antepartum; and Inguinal hernia of right side, reducible without obstruction or gangrene on their problem list.  Patient reports  bulge on R groin area, more noticeable when standing, a little tender but goes away periodically.  .  Contractions: Not present. Vag. Bleeding: None.  Movement: Present. Denies leaking of fluid.   The following portions of the patient's history were reviewed and updated as appropriate: allergies, current medications, past family history, past medical history, past social history, past surgical history and problem list.   Objective:   Vitals:   07/18/23 0916  BP: 137/69  Pulse: 97  Weight: 143 lb (64.9 kg)    Fetal Status: Fetal Heart Rate (bpm): 154   Movement: Present     General:  Alert, oriented and cooperative. Patient is in no acute distress.  Skin: Skin is warm and dry. No rash noted.   Cardiovascular: Normal heart rate noted  Respiratory: Normal respiratory effort, no problems with respiration noted  Abdomen: Soft, gravid, appropriate for gestational age.  Pain/Pressure: Absent     Pelvic: Rxam performed in the presence of a chaperone.  Small right inguinal hernia noted, reducible without obstruction or gangrene. Mild tenderness to palpation.         Extremities: Normal range of motion.  Edema: None  Mental Status: Normal mood and affect. Normal behavior. Normal judgment and thought content.   [place] MFM OB COMP + 14 WK Result Date: 07/05/2023 ----------------------------------------------------------------------  OBSTETRICS REPORT                       (Signed Final 07/05/2023 11:16 am)  ---------------------------------------------------------------------- Patient Info  ID #:       [id number]                          D.O.B.:  2001/03/13 (22 yrs)(F)  Name:       Sandra Sullivan               Visit Date: 07/05/2023 09:18 am ---------------------------------------------------------------------- Performed By  Attending:        Ma Rings MD         Ref. Address:     [address]                                                             [address]  Performed By:     Emeline Darling BS,      [facility]  Referred By:      [facility] Mila Merry ---------------------------------------------------------------------- Orders  #  Description                           Code        Ordered By  1  [place] MFM OB COMP + 14 WK                [id number]    Jaynie Collins ----------------------------------------------------------------------  #  Order #                     Accession #                Episode #  1  [id number]                   [id number]                 [id number] ---------------------------------------------------------------------- Indications  Fetal or maternal indication                   O35.8XX0  Encounter for antenatal screening for          Z36.3  malformations  [redacted] weeks gestation of pregnancy                Z3A.19 ---------------------------------------------------------------------- Fetal Evaluation  Num Of Fetuses:         1  Fetal Heart Rate(bpm):  150  Cardiac Activity:       Observed  Presentation:           Variable  Placenta:               Anterior  P. Cord Insertion:      Marginal insertion  Amniotic Fluid  AFI FV:      Within normal limits                              Largest Pocket(cm)                              6.23  ---------------------------------------------------------------------- Biometry  BPD:      46.1  mm     G. Age:  20w 0d         86  %    CI:        76.17   %    70 - 86                                                          FL/HC:      18.5   %    16.1 - 18.3  HC:      167.4  mm     G. Age:  19w 3d  63  %    HC/AC:      1.04        1.09 - 1.39  AC:      161.2  mm     G. Age:  21w 1d         97  %    FL/BPD:     67.0   %  FL:       30.9  mm     G. Age:  19w 4d         65  %    FL/AC:      19.2   %    20 - 24  HUM:      28.5  mm     G. Age:  19w 1d         56  %  CER:      19.7  mm     G. Age:  19w 1d         40  %  NFT:       5.6  mm  LV:        7.3  mm  CM:        3.7  mm  Est. FW:     347  gm    0 lb 12 oz      98  % ---------------------------------------------------------------------- OB History  Gravidity:    1         Term:   0        Prem:   0        SAB:   0  TOP:          0       Ectopic:  0        Living: 0 ---------------------------------------------------------------------- Gestational Age  U/S Today:     20w 0d                                        EDD:   11/22/23  Best:          19w 0d     Det. By:  U/S C R L  (05/03/23)    EDD:   11/29/23 ---------------------------------------------------------------------- Targeted Anatomy  Central Nervous System  Calvarium/Cranial V.:  Appears normal         Cereb./Vermis:          Appears normal  Cavum:                 Appears normal         Cisterna Magna:         Appears normal  Lateral Ventricles:    Appears normal         Midline Falx:           Appears normal  Choroid Plexus:        Appears normal  Spine  Cervical:              Appears normal         Sacral:                 Appears normal  Thoracic:              Appears normal         Shape/Curvature:        Appears normal  Lumbar:  Appears normal  Head/Neck  Lips:                  Appears normal         Profile:                Appears normal  Neck:                  Appears normal          Orbits/Eyes:            Appears normal  Nuchal Fold:           Appears normal         Mandible:               Appears normal  Nasal Bone:            Present                Maxilla:                Appears normal  Palate:                Appears normal  Thorax  4 Chamber View:        Not well visualized    Interventr. Septum:     Not well visualized  Cardiac Rhythm:        Normal                 Cardiac Axis:           Normal  Cardiac Situs:         Appears normal         Diaphragm:              Not well visualized  Rt Outflow Tract:      Appears normal         3 Vessel View:          Not well visualized  Lt Outflow Tract:      Appears normal         3 V Trachea View:       Not well visualized  Aortic Arch:           Appears normal         IVC:                    Appears normal  Ductal Arch:           Appears normal         Crossing:               Appears normal  SVC:                   Appears normal  Abdomen  Ventral Wall:          Appears normal         Lt Kidney:              Appears normal  Cord Insertion:        Appears normal         Rt Kidney:              Appears normal  Situs:                 Appears normal         Bladder:  Appears normal  Stomach:               Appears normal  Extremities  Lt Humerus:            Appears normal         Lt Femur:               Appears normal  Rt Humerus:            Appears normal         Rt Femur:               Appears normal  Lt Forearm:            Appears normal         Lt Lower Leg:           Appears normal  Rt Forearm:            Appears normal         Rt Lower Leg:           Appears normal  Lt Hand:               Not well visualized    Lt Foot:                Nml heel/foot  Rt Hand:               Not well visualized    Rt Foot:                Nml heel/foot  Other  Umbilical Cord:        Normal 3-vessel        Genitalia:              Female-nml ---------------------------------------------------------------------- Cervix Uterus Adnexa  Cervix  Length:             3.3  cm.  Normal appearance by transabdominal scan  Adnexa  No abnormality visualized ---------------------------------------------------------------------- Comments  This patient was seen for a detailed fetal anatomy scan.  She denies any significant past medical history and denies  any problems in her current pregnancy.  She had a cell free DNA test earlier in her pregnancy which  indicated a low risk for trisomy 46, 17, and 13. A female fetus is  predicted.  She was informed that the fetal growth and amniotic fluid  level were appropriate for her gestational age.  There were no obvious fetal anomalies noted on today's  ultrasound exam.  However, today's exam was limited due to  the fetal position.  The patient was informed that anomalies may be missed due  to technical limitations. If the fetus is in a suboptimal position  or maternal habitus is increased, visualization of the fetus in  the maternal uterus may be impaired.  A marginal placental cord insertion was noted on today's  exam.  Due to this finding, we will continue to follow her with  growth ultrasounds throughout her pregnancy.  A follow-up exam was scheduled in 5 weeks to complete the  views of the fetal anatomy and to assess the fetal growth. ----------------------------------------------------------------------                  Ma Rings, MD Electronically Signed Final Report   07/05/2023 11:16 am ----------------------------------------------------------------------    Assessment and Plan:  Pregnancy: G1P0 at [redacted]w[redacted]d 1. Inguinal hernia of right side, reducible without obstruction or gangrene Will continue to observe for now. Pain precautions given.  Patient assured that this can happen in pregnancy, just needs observation for now.  2. [redacted] weeks gestation of pregnancy 3. Encounter for supervision of normal first pregnancy in second trimester (Primary) Incomplete anatomy scan, rescan  scheduled.   Preterm labor symptoms and general obstetric  precautions including but not limited to vaginal bleeding, contractions, leaking of fluid and fetal movement were reviewed in detail with the patient. Please refer to After Visit Summary for other counseling recommendations.   Return in about 4 weeks (around 08/15/2023) for OFFICE OB VISIT (MD only).  Future Appointments  Date Time Provider Department Center  08/12/2023 11:30 AM [facility] US1 [facility]    Jaynie Collins, MD '

## 2023-08-09 ENCOUNTER — Encounter: Payer: Self-pay | Admitting: Obstetrics & Gynecology

## 2023-08-12 ENCOUNTER — Other Ambulatory Visit: Payer: Self-pay

## 2023-08-12 ENCOUNTER — Encounter: Payer: Self-pay | Admitting: *Deleted

## 2023-08-12 ENCOUNTER — Other Ambulatory Visit: Payer: Self-pay | Admitting: *Deleted

## 2023-08-12 ENCOUNTER — Ambulatory Visit: Payer: Medicaid Other | Attending: Maternal & Fetal Medicine | Admitting: Maternal & Fetal Medicine

## 2023-08-12 ENCOUNTER — Ambulatory Visit: Payer: Medicaid Other | Attending: Obstetrics

## 2023-08-12 DIAGNOSIS — O3660X0 Maternal care for excessive fetal growth, unspecified trimester, not applicable or unspecified: Secondary | ICD-10-CM | POA: Diagnosis not present

## 2023-08-12 DIAGNOSIS — O3662X0 Maternal care for excessive fetal growth, second trimester, not applicable or unspecified: Secondary | ICD-10-CM | POA: Diagnosis not present

## 2023-08-12 DIAGNOSIS — Z3A24 24 weeks gestation of pregnancy: Secondary | ICD-10-CM | POA: Diagnosis not present

## 2023-08-12 DIAGNOSIS — Z362 Encounter for other antenatal screening follow-up: Secondary | ICD-10-CM

## 2023-08-12 DIAGNOSIS — O3663X0 Maternal care for excessive fetal growth, third trimester, not applicable or unspecified: Secondary | ICD-10-CM

## 2023-08-12 NOTE — Progress Notes (Signed)
   Patient information  Patient Name: Sandra Sullivan  Patient MRN:   [id number]  Referring practice: MFM Referring Provider: [facility])  MFM CONSULT  FELIX ADAMS is a 23 y.o. G1P0 at [redacted]w[redacted]d here for ultrasound and consultation. Patient Active Problem List   Diagnosis Date Noted   Large for gestational age fetus affecting management of mother, antepartum 08/12/2023   Inguinal hernia of right side, reducible without obstruction or gangrene 07/18/2023   Group B Streptococcus urinary tract infection affecting pregnancy, antepartum 05/07/2023   Encounter for supervision of normal first pregnancy in second trimester 05/03/2023   Sandra Sullivan is doing well today with no acute concerns. She denies contractions, bleeding, or loss of fluid and reports good fetal movement.   RE large for gestational age:  The estimated fetal weight was at the 93rd percentile today.  I discussed this is may be due to glucose intolerance and a GTT is recommended to be done in the next few weeks. Serial growth US  are to be done later in the pregnancy. I briefly discussed the indication for cesarean delivery based on a positive or negative GTT.   Sonographic findings Single intrauterine pregnancy at 24w 3d.  Fetal cardiac activity:  Observed and appears normal. Presentation: Cephalic. Interval fetal anatomy appears normal. Fetal biometry shows the estimated fetal weight at the 93 percentile. Amniotic fluid volume: Within normal limits. MVP: 6.53 cm. Placenta: Anterior.  There are limitations of prenatal ultrasound such as the inability to detect certain abnormalities due to poor visualization. Various factors such as fetal position, gestational age and maternal body habitus may increase the difficulty in visualizing the fetal anatomy.    Recommendations - F/u growth around 32 weeks due to LGA. - GTT should occur in the next 1-2 weeks.   Review of Systems: A review of  systems was performed and was negative except per HPI   Vitals and Physical Exam    07/18/2023    9:16 AM 07/05/2023    8:20 AM 06/23/2023    3:15 PM  Vitals with BMI  Weight 143 lbs    BMI 25.34    Systolic 137 106 323  Diastolic 69 61 81  Pulse 97 63 80  Sitting comfortably on the sonogram table Nonlabored breathing Normal rate and rhythm Abdomen is nontender  Past pregnancies OB History  Gravida Para Term Preterm AB Living  1       SAB IAB Ectopic Multiple Live Births          # Outcome Date GA Lbr Len/2nd Weight Sex Type Anes PTL Lv  1 Current            I spent 20 minutes reviewing the patients chart, including labs and images as well as counseling the patient about her medical conditions. Greater than 50% of the time was spent in direct face-to-face patient counseling.  Penney Bowling  MFM, [facility]   08/12/2023  5:24 PM

## 2023-08-15 ENCOUNTER — Encounter: Payer: Self-pay | Admitting: Obstetrics and Gynecology

## 2023-08-15 ENCOUNTER — Ambulatory Visit (INDEPENDENT_AMBULATORY_CARE_PROVIDER_SITE_OTHER): Payer: Medicaid Other | Admitting: Obstetrics and Gynecology

## 2023-08-15 VITALS — BP 137/75 | HR 108 | Wt 145.0 lb

## 2023-08-15 DIAGNOSIS — Z3A24 24 weeks gestation of pregnancy: Secondary | ICD-10-CM | POA: Diagnosis not present

## 2023-08-15 DIAGNOSIS — K4090 Unilateral inguinal hernia, without obstruction or gangrene, not specified as recurrent: Secondary | ICD-10-CM

## 2023-08-15 NOTE — Progress Notes (Signed)
ROB   Notes hernia discomfort yet doing ok. Pt is still currently working at [employer].  CC: None

## 2023-08-15 NOTE — Progress Notes (Signed)
   PRENATAL VISIT NOTE  Subjective:  Sandra Sullivan is a 23 y.o. G1P0 at [redacted]w[redacted]d being seen today for ongoing prenatal care.  She is currently monitored for the following issues for this low-risk pregnancy and has Encounter for supervision of normal first pregnancy in second trimester; Group B Streptococcus urinary tract infection affecting pregnancy, antepartum; Inguinal hernia of right side, reducible without obstruction or gangrene; and Large for gestational age fetus affecting management of mother, antepartum on their problem list.  Patient reports  has right sided inguinal hernia that gets worse at the end of the day .  Contractions: Not present. Vag. Bleeding: None.  Movement: Present. Denies leaking of fluid.   The following portions of the patient's history were reviewed and updated as appropriate: allergies, current medications, past family history, past medical history, past social history, past surgical history and problem list.   Objective:   Vitals:   08/15/23 1122  BP: 137/75  Pulse: (!) 108  Weight: 145 lb (65.8 kg)    Fetal Status: Fetal Heart Rate (bpm): 153   Movement: Present     General:  Alert, oriented and cooperative. Patient is in no acute distress.  Skin: Skin is warm and dry. No rash noted.   Cardiovascular: Normal heart rate noted  Respiratory: Normal respiratory effort, no problems with respiration noted  Abdomen: Soft, gravid, appropriate for gestational age.  Pain/Pressure: Absent     Pelvic: Cervical exam deferred        Extremities: Normal range of motion.  Edema: None  Mental Status: Normal mood and affect. Normal behavior. Normal judgment and thought content.   Assessment and Plan:  Pregnancy: G1P0 at [redacted]w[redacted]d 1. [redacted] weeks gestation of pregnancy (Primary) - Ambulatory referral to General Surgery  2. Inguinal hernia of right side, reducible without obstruction or gangrene ED precautions given and lifting precautions. Recommend Gen surg eval to see if  it is a true hernia. Pt states it's worse at the end of the day. ?more fullness, nttp on the right side at the right inguinal fold/mons area. Pt denies any s/s prior to pregnancy. Pt works at [employer] so on her feet a lot. Note for chair already given.  - Ambulatory referral to General Surgery  3. LGA Will do GTT on the earlier side of recommended range  Preterm labor symptoms and general obstetric precautions including but not limited to vaginal bleeding, contractions, leaking of fluid and fetal movement were reviewed in detail with the patient. Please refer to After Visit Summary for other counseling recommendations.   Return for fasting 2hr GTT sometime in the next week. rob in 3wks with any provider.  Future Appointments  Date Time Provider Department Center  09/12/2023  8:35 AM Anyanwu, Jethro Bastos, MD [facility]  09/16/2023  9:00 AM [facility] LAB [facility]  10/07/2023 11:15 AM [facility] NURSE [facility]  10/07/2023 11:30 AM [facility] US5 [facility]    [place] Bing, MD

## 2023-08-19 ENCOUNTER — Other Ambulatory Visit: Payer: Medicaid Other

## 2023-08-19 DIAGNOSIS — Z3402 Encounter for supervision of normal first pregnancy, second trimester: Secondary | ICD-10-CM

## 2023-08-20 ENCOUNTER — Encounter: Payer: Self-pay | Admitting: General Surgery

## 2023-08-20 ENCOUNTER — Ambulatory Visit (INDEPENDENT_AMBULATORY_CARE_PROVIDER_SITE_OTHER): Payer: Medicaid Other | Admitting: General Surgery

## 2023-08-20 VITALS — BP 114/72 | HR 90 | Temp 98.5°F | Ht 63.0 in | Wt 135.0 lb

## 2023-08-20 DIAGNOSIS — K4090 Unilateral inguinal hernia, without obstruction or gangrene, not specified as recurrent: Secondary | ICD-10-CM

## 2023-08-20 LAB — CBC
Hematocrit: 35 % (ref 34.0–46.6)
Hemoglobin: 11.2 g/dL (ref 11.1–15.9)
MCH: 30.4 pg (ref 26.6–33.0)
MCHC: 32 g/dL (ref 31.5–35.7)
MCV: 95 fL (ref 79–97)
Platelets: 236 10*3/uL (ref 150–450)
RBC: 3.68 x10E6/uL — ABNORMAL LOW (ref 3.77–5.28)
RDW: 12.6 % (ref 11.7–15.4)
WBC: 11.3 10*3/uL — ABNORMAL HIGH (ref 3.4–10.8)

## 2023-08-20 LAB — RPR: RPR Ser Ql: NONREACTIVE

## 2023-08-20 LAB — GLUCOSE TOLERANCE, 2 HOURS W/ 1HR
Glucose, 1 hour: 100 mg/dL (ref 70–179)
Glucose, 2 hour: 92 mg/dL (ref 70–152)
Glucose, Fasting: 66 mg/dL — ABNORMAL LOW (ref 70–91)

## 2023-08-20 LAB — HIV ANTIBODY (ROUTINE TESTING W REFLEX): HIV Screen 4th Generation wRfx: NONREACTIVE

## 2023-08-20 NOTE — Patient Instructions (Addendum)
We want to wait until after you deliver to surgically repair this area.   We will have you follow up a couple of months after you deliver to reexamine the area and talk about surgery. We will get you scheduled for an ultrasound before we see you back. We will send you a letter about this appointment.   Femoral Hernia, Adult A hernia happens when an organ or tissue inside your body pushes out through a weak spot in your muscles. This makes a bulge. A femoral hernia is found in your upper thigh near your groin. The bulge may be made up of fat tissue or part of your intestine. You may be born with this type of hernia, but it may not cause symptoms until you're an adult. Or you may get a hernia as you get older. It tends to get worse over time. It won't go away on its own. What are the causes? The cause of a femoral hernia may not be known. It may be brought on by: Coughing. Straining the muscles in your abdomen. Carrying, pushing, or lifting heavy things. Straining when you poop. What increases the risk? You're more likely to get a femoral hernia if: You're female. You smoke or have lung disease. You're often constipated. This means you have trouble pooping. You strain when you pee. You're overweight. You're older than 60 years. What are the signs or symptoms?  The main symptom is a bulge, but the bulge may not always be seen. It may grow bigger or be easier to see when you cough or strain. In females, the bulge may form in the labia. These are the folds of skin on the outside of the genitals. If you can push the bulge back into your body, it may not cause pain. It may just feel a little numb. But if you can't push it in, it may lose its blood supply. This can cause: Sharp or severe pain. Nausea and vomiting. Redness or a dark color near the hernia. How is this diagnosed? A hernia may be diagnosed based on your symptoms, medical history, and an exam. Your health care provider may ask you to  cough or move in a way that makes the bulge easier to see. You may also have tests done. These may include: Ultrasound. CT scan. How is this treated? Surgery is the only treatment for a femoral hernia. If the hernia has lost its blood supply, you will need surgery right away. Follow these instructions at home: Activity You may have to avoid lifting. Ask your provider how much you can safely lift. Return to your normal activities as told by your provider. Ask your provider what activities are safe for you. Managing constipation You may need to take these actions to prevent or treat constipation. Drink enough fluid to keep your pee (urine) pale yellow. Take over-the-counter or prescription medicines. Eat foods that are high in fiber, such as beans, whole grains, and fresh fruits and vegetables. Limit foods that are high in fat and processed sugars, such as fried or sweet foods. General instructions Take over-the-counter and prescription medicines only as told by your provider. Follow instructions from your provider about what you may eat and drink. If you're overweight, work with your provider to lose weight. Do not use any products that contain nicotine or tobacco. These products include cigarettes, chewing tobacco, and vaping devices, such as e-cigarettes. If you need help quitting, ask your provider. Contact a health care provider if: Your hernia becomes painful.  Your hernia gets larger. You have trouble pooping. You strain when you pee. Get help right away if: Your pain gets worse all of a sudden. You have pain along with any of these symptoms: Chills. A fever. Nausea and vomiting. The bulge turns: Dark. Red. Painful to touch. These symptoms may be an emergency. Get help right away. Call 911. Do not wait to see if the symptoms will go away. Do not drive yourself to the hospital. This information is not intended to replace advice given to you by your health care provider. Make  sure you discuss any questions you have with your health care provider. Document Revised: 12/04/2022 Document Reviewed: 09/11/2022 Elsevier Patient Education  2024 [employer].

## 2023-08-21 ENCOUNTER — Encounter: Payer: Self-pay | Admitting: Obstetrics and Gynecology

## 2023-08-23 ENCOUNTER — Encounter: Payer: Self-pay | Admitting: Obstetrics & Gynecology

## 2023-09-05 ENCOUNTER — Ambulatory Visit: Payer: Medicaid Other | Admitting: Obstetrics and Gynecology

## 2023-09-05 VITALS — BP 128/74 | HR 97 | Wt 149.0 lb

## 2023-09-05 DIAGNOSIS — O3660X0 Maternal care for excessive fetal growth, unspecified trimester, not applicable or unspecified: Secondary | ICD-10-CM | POA: Diagnosis not present

## 2023-09-05 DIAGNOSIS — Z3A27 27 weeks gestation of pregnancy: Secondary | ICD-10-CM

## 2023-09-05 DIAGNOSIS — O2340 Unspecified infection of urinary tract in pregnancy, unspecified trimester: Secondary | ICD-10-CM

## 2023-09-05 DIAGNOSIS — B951 Streptococcus, group B, as the cause of diseases classified elsewhere: Secondary | ICD-10-CM

## 2023-09-05 DIAGNOSIS — K4090 Unilateral inguinal hernia, without obstruction or gangrene, not specified as recurrent: Secondary | ICD-10-CM | POA: Diagnosis not present

## 2023-09-05 NOTE — Progress Notes (Signed)
   PRENATAL VISIT NOTE  Subjective:  Sandra Sullivan is a 23 y.o. G1P0 at [redacted]w[redacted]d being seen today for ongoing prenatal care.  She is currently monitored for the following issues for this low-risk pregnancy and has Encounter for supervision of normal first pregnancy in second trimester; Group B Streptococcus urinary tract infection affecting pregnancy, antepartum; Inguinal hernia of right side, reducible without obstruction or gangrene; and Large for gestational age fetus affecting management of mother, antepartum on their problem list.  Patient reports no complaints.   . Vag. Bleeding: None.  Movement: Present. Denies leaking of fluid.   The following portions of the patient's history were reviewed and updated as appropriate: allergies, current medications, past family history, past medical history, past social history, past surgical history and problem list.   Objective:   Vitals:   09/05/23 1132  BP: 128/74  Pulse: 97  Weight: 149 lb (67.6 kg)    Fetal Status: Fetal Heart Rate (bpm): 148   Movement: Present     General:  Alert, oriented and cooperative. Patient is in no acute distress.  Skin: Skin is warm and dry. No rash noted.   Cardiovascular: Normal heart rate noted  Respiratory: Normal respiratory effort, no problems with respiration noted  Abdomen: Soft, gravid, appropriate for gestational age.  Pain/Pressure: Absent     Pelvic: Cervical exam deferred        Extremities: Normal range of motion.  Edema: None  Mental Status: Normal mood and affect. Normal behavior. Normal judgment and thought content.   Assessment and Plan:  Pregnancy: G1P0 at [redacted]w[redacted]d 1. [redacted] weeks gestation of pregnancy (Primary) GTT, labs wnl  2. Group B Streptococcus urinary tract infection affecting pregnancy, antepartum Test of cure neg. Treat in labor  3. Excessive fetal growth affecting management of pregnancy, antepartum, single or unspecified fetus Normal GTT. Follow up serial scans 2/10: 93%,  855g, ac 92%, afi wnl  4. Inguinal hernia of right side, reducible without obstruction or gangrene Seen by Gen Surg. Plan for PP follow up and talk more re: surgery.   Preterm labor symptoms and general obstetric precautions including but not limited to vaginal bleeding, contractions, leaking of fluid and fetal movement were reviewed in detail with the patient. Please refer to After Visit Summary for other counseling recommendations.   Return in about 2 weeks (around 09/19/2023), or if symptoms worsen or fail to improve.  Future Appointments  Date Time Provider Department Center  09/20/2023 11:15 AM Federico Flake, MD [facility]  10/07/2023 11:15 AM [facility] NURSE [facility]  10/07/2023 11:30 AM [facility] US5 [facility]    [place] Bing, MD

## 2023-09-05 NOTE — Progress Notes (Signed)
ROB   CC: none    

## 2023-09-12 ENCOUNTER — Encounter: Payer: Medicaid Other | Admitting: Obstetrics & Gynecology

## 2023-09-12 ENCOUNTER — Other Ambulatory Visit: Payer: Medicaid Other

## 2023-09-16 ENCOUNTER — Other Ambulatory Visit: Payer: Medicaid Other

## 2023-09-16 NOTE — Progress Notes (Signed)
 Patient ID: Sandra Sullivan, female   DOB: 09-Mar-2001, 23 y.o.   MRN: [id number] CC: Possible Right Inguinal Hernia  History of Present Illness Sandra Sullivan is a 23 y.o. female who is [redacted] weeks pregnant who presents in consultation for possible right inguinal hernia.  The patient reports that she notices a bulge in her right groin.  She says that she gets soreness that radiates to her labia at the end of the day.  She says that it does seem to get larger when she strains.  She denies any obstructive symptoms including no nausea or vomiting or obstipation.  She also reports that she has not had any overlying skin changes.  She has no significant past medical history but again is [redacted] weeks pregnant.  She says that her baby is large for gestational age this is her first pregnancy.  Past Medical History Past Medical History:  Diagnosis Date   ADHD (attention deficit hyperactivity disorder)    Seasonal allergies        History reviewed. No pertinent surgical history.  No Known Allergies  Current Outpatient Medications  Medication Sig Dispense Refill   Prenatal Vit-Fe Fumarate-FA (MULTIVITAMIN-PRENATAL) 27-0.8 MG TABS tablet Take 1 tablet by mouth daily at 12 noon.     No current facility-administered medications for this visit.    Family History Family History  Problem Relation Age of Onset   Migraines Mother    Anxiety disorder Maternal Aunt    Depression Maternal Aunt    Autism Neg Hx    ADD / ADHD Neg Hx    Bipolar disorder Neg Hx        Social History Social History   Tobacco Use   Smoking status: Never    Passive exposure: Never   Smokeless tobacco: Never  Vaping Use   Vaping status: Never Used  Substance Use Topics   Alcohol use: No   Drug use: No        ROS Full ROS of systems performed and is otherwise negative there than what is stated in the HPI  Physical Exam Blood pressure 114/72, pulse 90, temperature 98.5 F (36.9 C), height 5\' 3"  (1.6 m), weight  135 lb (61.2 kg), last menstrual period 02/05/2023, SpO2 98%. No acute distress, normal work of breathing on room air, regular rate and rhythm, abdomen is soft, gravid, nontender and nondistended.  Right inguinal exam performed in the presence of a chaperone.  She does have some fullness that seems to be above the right inguinal ligament.  I do not feel any bowel herniating and it is difficult to tell whether there is a true hernia present.  It does not seem to enlarge very much when she Valsalva's.  Data Reviewed Labs reviewed and significant for slightly increased white count which is nonconcerning and she is not anemic.  I have personally reviewed the patient's imaging and medical records.    Assessment    23 year old female who is [redacted] weeks pregnant with concerns for right inguinal hernia.  She does have some soreness at the end of the day but otherwise it does not bother her much.  On exam is quite difficult to tell if there is a true hernia.  I do not think this is a femoral hernia based on exam.  Due to this I think it is better that we wait until she is not pregnant to further work this up and if she does have a true hernia repair.  We will plan for  an ultrasound after she has delivered and see her back in the office in 6 months to discuss the ultrasound results and potential need for hernia surgery.  In the meantime recommend continued prenatal care with OB/GYN team and to try to avoid heavy lifting   Kandis Cocking

## 2023-09-20 ENCOUNTER — Encounter: Admitting: Family Medicine

## 2023-09-23 ENCOUNTER — Ambulatory Visit: Admitting: Obstetrics and Gynecology

## 2023-09-23 VITALS — BP 113/73 | HR 101 | Wt 148.0 lb

## 2023-09-23 DIAGNOSIS — Z3A30 30 weeks gestation of pregnancy: Secondary | ICD-10-CM | POA: Diagnosis not present

## 2023-09-23 NOTE — Progress Notes (Signed)
   PRENATAL VISIT NOTE  Subjective:  Sandra Sullivan is a 23 y.o. G1P0 at [redacted]w[redacted]d being seen today for ongoing prenatal care.  She is currently monitored for the following issues for this high-risk pregnancy and has Encounter for supervision of normal first pregnancy in second trimester; Group B Streptococcus urinary tract infection affecting pregnancy, antepartum; Inguinal hernia of right side, reducible without obstruction or gangrene; and Large for gestational age fetus affecting management of mother, antepartum on their problem list.  Patient reports no complaints.  Contractions: Not present. Vag. Bleeding: None.  Movement: Present. Denies leaking of fluid.   The following portions of the patient's history were reviewed and updated as appropriate: allergies, current medications, past family history, past medical history, past social history, past surgical history and problem list.   Objective:   Vitals:   09/23/23 1347  BP: 113/73  Pulse: (!) 101  Weight: 148 lb (67.1 kg)    Fetal Status: Fetal Heart Rate (bpm): 143 Fundal Height: 29 cm Movement: Present     General:  Alert, oriented and cooperative. Patient is in no acute distress.  Skin: Skin is warm and dry. No rash noted.   Cardiovascular: Normal heart rate noted  Respiratory: Normal respiratory effort, no problems with respiration noted  Abdomen: Soft, gravid, appropriate for gestational age.  Pain/Pressure: Absent     Pelvic: Cervical exam deferred        Extremities: Normal range of motion.  Edema: None  Mental Status: Normal mood and affect. Normal behavior. Normal judgment and thought content.   Assessment and Plan:  Pregnancy: G1P0 at [redacted]w[redacted]d 1. [redacted] weeks gestation of pregnancy (Primary) 28wk labs wnl. Weight stable with 13lbs total weight gain this pregnancy and no issues with taking PO. D/w her goal for approx 25lbs by [facility].    2. Group B Streptococcus urinary tract infection affecting pregnancy, antepartum Test of  cure neg. Treat in labor   3. Excessive fetal growth affecting management of pregnancy, antepartum, single or unspecified fetus Normal GTT. Follow up serial scans 2/10: 93%, 855g, ac 92%, afi wnl   4. Inguinal hernia of right side, reducible without obstruction or gangrene Seen by Gen Surg. Plan for PP follow up and talk more re: surgery.   Preterm labor symptoms and general obstetric precautions including but not limited to vaginal bleeding, contractions, leaking of fluid and fetal movement were reviewed in detail with the patient. Please refer to After Visit Summary for other counseling recommendations.   Return in about 2 weeks (around 10/07/2023) for low risk ob, in person, md or app.  Future Appointments  Date Time Provider Department Center  10/04/2023 10:35 AM [place] Large, Jethro Bastos, MD [facility]  10/07/2023 11:00 AM [facility] PROVIDER 1 [facility]  10/07/2023 11:30 AM [facility] US5 [facility]  10/17/2023 10:35 AM Anyanwu, Jethro Bastos, MD [facility]  11/01/2023 11:15 AM Federico Flake, MD [facility]  11/08/2023 10:55 AM Federico Flake, MD [facility]  11/15/2023 10:55 AM [place] Bing, MD [facility]  11/22/2023 10:35 AM [place] Morin, Isa Rankin, MD [facility]    [place] Bing, MD

## 2023-09-23 NOTE — Progress Notes (Signed)
 ROB   CC: None

## 2023-10-04 ENCOUNTER — Encounter: Payer: Self-pay | Admitting: Obstetrics & Gynecology

## 2023-10-04 ENCOUNTER — Ambulatory Visit: Admitting: Obstetrics & Gynecology

## 2023-10-04 VITALS — BP 108/72 | HR 90 | Wt 152.2 lb

## 2023-10-04 DIAGNOSIS — Z3A32 32 weeks gestation of pregnancy: Secondary | ICD-10-CM

## 2023-10-04 DIAGNOSIS — O0993 Supervision of high risk pregnancy, unspecified, third trimester: Secondary | ICD-10-CM

## 2023-10-04 DIAGNOSIS — O3660X0 Maternal care for excessive fetal growth, unspecified trimester, not applicable or unspecified: Secondary | ICD-10-CM | POA: Diagnosis not present

## 2023-10-04 NOTE — Patient Instructions (Signed)

## 2023-10-04 NOTE — Progress Notes (Signed)
 PRENATAL VISIT NOTE  Subjective:  Sandra Sullivan is a 23 y.o. G1P0 at [redacted]w[redacted]d being seen today for ongoing prenatal care.  She is currently monitored for the following issues for this high-risk pregnancy and has Supervision of high-risk pregnancy; Group B Streptococcus urinary tract infection affecting pregnancy, antepartum; Inguinal hernia of right side, reducible without obstruction or gangrene; and Large for gestational age fetus affecting management of mother, antepartum on their problem list.  Patient reports no complaints.  Contractions: Not present. Vag. Bleeding: None.  Movement: Present. Denies leaking of fluid.   The following portions of the patient's history were reviewed and updated as appropriate: allergies, current medications, past family history, past medical history, past social history, past surgical history and problem list.   Objective:   Vitals:   10/04/23 1033  BP: 108/72  Pulse: 90  Weight: 152 lb 3.2 oz (69 kg)    Fetal Status: Fetal Heart Rate (bpm): 143   Movement: Present     General:  Alert, oriented and cooperative. Patient is in no acute distress.  Skin: Skin is warm and dry. No rash noted.   Cardiovascular: Normal heart rate noted  Respiratory: Normal respiratory effort, no problems with respiration noted  Abdomen: Soft, gravid, appropriate for gestational age.  Pain/Pressure: Present     Pelvic: Cervical exam deferred        Extremities: Normal range of motion.  Edema: None  Mental Status: Normal mood and affect. Normal behavior. Normal judgment and thought content.   [place] MFM OB FOLLOW UP Result Date: 08/12/2023 ----------------------------------------------------------------------  OBSTETRICS REPORT                       (Signed Final 08/12/2023 05:22 pm) ---------------------------------------------------------------------- Patient Info  ID #:       [id number]                          D.O.B.:  October 13, 2000 (22 yrs)(F)  Name:       Sandra Sullivan                Visit Date: 08/12/2023 11:45 am ---------------------------------------------------------------------- Performed By  Attending:        Braxton Feathers DO       Ref. Address:     [address]                                                             [address]  Performed By:     Emeline Darling BS,      [facility]  Referred By:      [facility] ---------------------------------------------------------------------- Orders  #  Description                           Code        Ordered By  1  [place] MFM OB FOLLOW UP                   [phone]    Rosana Hoes ----------------------------------------------------------------------  #  Order #                     Accession #                Episode #  1  [id number]                   [id number]                 [id number] ---------------------------------------------------------------------- Indications  Large for gestational age fetus affecting      O36.60X0  management of mother  [redacted] weeks gestation of pregnancy                Z3A.24  Antenatal follow-up for nonvisualized fetal    Z36.2  anatomy ---------------------------------------------------------------------- Fetal Evaluation  Num Of Fetuses:         1  Fetal Heart Rate(bpm):  138  Cardiac Activity:       Observed  Presentation:           Cephalic  Placenta:               Anterior  P. Cord Insertion:      Visualized  Amniotic Fluid  AFI FV:      Within normal limits                              Largest Pocket(cm)                              6.53 ---------------------------------------------------------------------- Biometry  BPD:      63.8  mm     G. Age:  25w 6d         87  %    CI:        76.04   %    70 - 86                                                          FL/HC:      19.7    %    18.7 - 20.9  HC:      231.9  mm     G. Age:  25w 2d         61  %    HC/AC:      1.06  1.05 - 1.21  AC:      219.7  mm     G. Age:  26w 3d         92  %    FL/BPD:     71.5   %    71 - 87  FL:       45.6  mm     G. Age:  25w 1d         59  %    FL/AC:      20.8   %    20 - 24  Est. FW:     855  gm    1 lb 14 oz      93  % ---------------------------------------------------------------------- OB History  Gravidity:    1         Term:   0        Prem:   0        SAB:   0  TOP:          0       Ectopic:  0        Living: 0 ---------------------------------------------------------------------- Gestational Age  U/S Today:     25w 5d                                        EDD:   11/20/23  Best:          24w 3d     Det. By:  U/S C R L  (05/03/23)    EDD:   11/29/23 ---------------------------------------------------------------------- Targeted Anatomy  Central Nervous System  Calvarium/Cranial V.:  Appears normal         Cereb./Vermis:          Appears normal  Cavum:                 Appears normal         Cisterna Magna:         Previously seen  Lateral Ventricles:    Appears normal         Midline Falx:           Previously seen  Choroid Plexus:        Appears normal  Spine  Cervical:              Previously seen        Sacral:                 Previously seen  Thoracic:              Previously seen        Shape/Curvature:        Previously seen  Lumbar:                Previously seen  Head/Neck  Lips:                  Appears normal         Profile:                Appears normal  Neck:                  Previously seen        Orbits/Eyes:            Appears normal  Nuchal Fold:  Previously seen        Mandible:               Appears normal  Nasal Bone:            Present                Maxilla:                Appears normal  Palate:                Appears normal  Thorax  4 Chamber View:        Appears normal         SVC:                    Previously seen  Cardiac Activity:      Observed                Interventr. Septum:     Appears normal  Cardiac Rhythm:        Normal                 Cardiac Axis:           Normal  Cardiac Situs:         Appears normal         Diaphragm:              Appears normal  Rt Outflow Tract:      Appears normal         3 Vessel View:          Appears normal  Lt Outflow Tract:      Appears normal         3 V Trachea View:       Appears normal  Aortic Arch:           Previously seen        IVC:                    Previously seen  Ductal Arch:           Previously seen        Crossing:               Previously seen  Abdomen  Ventral Wall:          Previously seen        Lt Kidney:              Appears normal  Cord Insertion:        Previously seen        Rt Kidney:              Appears normal  Situs:                 Appears normal         Bladder:                Appears normal  Stomach:               Appears normal  Extremities  Lt Humerus:            Previously seen        Lt Femur:               Previously seen  Rt Humerus:            Previously seen  Rt Femur:               Previously seen  Lt Forearm:            Previously seen        Lt Lower Leg:           Previously seen  Rt Forearm:            Previously seen        Rt Lower Leg:           Previously seen  Lt Hand:               Open hand nml          Lt Foot:                Previously seen  Rt Hand:               visualized             Rt Foot:                Previously seen  Other  Umbilical Cord:        Previously seen        Genitalia:              Female-nml ---------------------------------------------------------------------- Cervix Uterus Adnexa  Cervix  Not visualized (advanced GA >24wks) ---------------------------------------------------------------------- Comments  MFM Consult Note  Sandra Sullivan is doing well today with no acute  concerns. She denies contractions, bleeding, or loss of fluid  and reports good fetal movement.  RE large for gestational age:  The estimated fetal weight was  at the 93rd percentile  today.  I discussed this is may be due  to glucose intolerance and a GTT is recommended to be  done in the next few weeks. Serial growth [place] are to be done  later in the pregnancy. I briefly discussed the indication for  cesarean delivery based on a positive or negative GTT.  Sonographic findings  Single intrauterine pregnancy at 24w 3d.  Fetal cardiac activity:  Observed and appears normal.  Presentation: Cephalic.  Interval fetal anatomy appears normal.  Fetal biometry shows the estimated fetal weight at the 93  percentile.  Amniotic fluid volume: Within normal limits. MVP: 6.53 cm.  Placenta: Anterior.  There are limitations of prenatal ultrasound such as the  inability to detect certain abnormalities due to poor  visualization. Various factors such as fetal position,  gestational age and maternal body habitus may increase the  difficulty in visualizing the fetal anatomy.  Recommendations  - F/u growth around 32 weeks due to LGA.  - GTT should occur in the next 1-2 weeks. ----------------------------------------------------------------------                  Braxton Feathers, DO Electronically Signed Final Report   08/12/2023 05:22 pm ----------------------------------------------------------------------    Assessment and Plan:  Pregnancy: G1P0 at [redacted]w[redacted]d 1. Excessive fetal growth affecting management of pregnancy, antepartum, single or unspecified fetus (Primary) Repeat scan scheduled scheduled on 10/07/2023.  Patient is nervous about this, reassured her that further discussion about delivery plan/modality will be had pending results of this scan and the scan done closer to delivery.  2. [redacted] weeks gestation of pregnancy 3. Supervision of high risk pregnancy in third trimester No other concerns.  Preterm labor symptoms and general obstetric precautions including but not limited to vaginal bleeding, contractions, leaking of fluid and fetal movement were reviewed in  detail with the patient. Please refer to After  Visit Summary for other counseling recommendations.   Return in about 2 weeks (around 10/18/2023) for OFFICE OB VISIT (MD only).  Future Appointments  Date Time Provider Department Center  10/07/2023 11:00 AM [facility] PROVIDER 1 [facility]  10/07/2023 11:30 AM [facility] US[facility]  10/17/2023 10:35 AM Terria Deschepper, Jethro Bastos, MD [facility]  11/01/2023 11:15 AM Federico Flake, MD [facility]  11/08/2023 10:55 AM Federico Flake, MD [facility]  11/15/2023 10:55 AM [place] Bing, MD [facility]  11/22/2023 10:35 AM Alvester Morin, Isa Rankin, MD [facility]    Jaynie Collins, MD

## 2023-10-07 ENCOUNTER — Ambulatory Visit: Payer: Medicaid Other

## 2023-10-07 ENCOUNTER — Ambulatory Visit: Payer: Medicaid Other | Attending: Obstetrics and Gynecology

## 2023-10-07 ENCOUNTER — Other Ambulatory Visit: Payer: Self-pay | Admitting: *Deleted

## 2023-10-07 ENCOUNTER — Ambulatory Visit (HOSPITAL_BASED_OUTPATIENT_CLINIC_OR_DEPARTMENT_OTHER): Admitting: Obstetrics and Gynecology

## 2023-10-07 VITALS — BP 119/71 | HR 100

## 2023-10-07 DIAGNOSIS — O0993 Supervision of high risk pregnancy, unspecified, third trimester: Secondary | ICD-10-CM

## 2023-10-07 DIAGNOSIS — O3660X0 Maternal care for excessive fetal growth, unspecified trimester, not applicable or unspecified: Secondary | ICD-10-CM | POA: Insufficient documentation

## 2023-10-07 DIAGNOSIS — O3663X0 Maternal care for excessive fetal growth, third trimester, not applicable or unspecified: Secondary | ICD-10-CM | POA: Insufficient documentation

## 2023-10-07 DIAGNOSIS — Z3A32 32 weeks gestation of pregnancy: Secondary | ICD-10-CM | POA: Insufficient documentation

## 2023-10-07 DIAGNOSIS — O43193 Other malformation of placenta, third trimester: Secondary | ICD-10-CM

## 2023-10-07 DIAGNOSIS — Z3689 Encounter for other specified antenatal screening: Secondary | ICD-10-CM

## 2023-10-07 NOTE — Progress Notes (Signed)
 Maternal-Fetal Medicine Consultation Name: Sandra Sullivan MRN: [id number]  G1 P0 at 32w 4d gestation.  Patient returned for fetal growth assessment. Marginal cord insertion and large for gestational age fetus were seen at previous ultrasound.  On today's ultrasound, amniotic fluid is normal good fetal activity seen.  Fetal growth is appropriate for gestational age.  The estimated fetal weight is at the 76 percentile and the abdominal circumference measurement at the 95th percentile.  Marginal cord insertion is seen again.    I reassured the patient of normal fetal growth assessment.   I explained that ultrasound has limitations in accurately estimating fetal weights.  Even if macrosomia is seen, early term delivery (37 or 38 weeks) will not be advised. Patient does not have gestational diabetes.  Recommendations -An appointment was made for her to return in 6 weeks for fetal growth assessment (estimation of fetal weight before delivery).  Consultation including face-to-face (more than 50%) counseling 10 minutes.

## 2023-10-17 ENCOUNTER — Encounter: Admitting: Obstetrics & Gynecology

## 2023-10-24 ENCOUNTER — Encounter: Payer: Self-pay | Admitting: Obstetrics & Gynecology

## 2023-10-24 ENCOUNTER — Ambulatory Visit: Admitting: Obstetrics & Gynecology

## 2023-10-24 VITALS — BP 110/68 | HR 93

## 2023-10-24 DIAGNOSIS — Z3A34 34 weeks gestation of pregnancy: Secondary | ICD-10-CM

## 2023-10-24 DIAGNOSIS — O0993 Supervision of high risk pregnancy, unspecified, third trimester: Secondary | ICD-10-CM | POA: Diagnosis not present

## 2023-10-24 NOTE — Progress Notes (Signed)
   PRENATAL VISIT NOTE  Subjective:  Sandra Sullivan is a 23 y.o. G1P0 at [redacted]w[redacted]d being seen today for ongoing prenatal care.  She is currently monitored for the following issues for this high-risk pregnancy and has Supervision of high-risk pregnancy; Group B Streptococcus urinary tract infection affecting pregnancy, antepartum; and Inguinal hernia of right side, reducible without obstruction or gangrene on their problem list.  Patient reports no complaints.  Contractions: Not present. Vag. Bleeding: None.  Movement: Present. Denies leaking of fluid.   The following portions of the patient's history were reviewed and updated as appropriate: allergies, current medications, past family history, past medical history, past social history, past surgical history and problem list.   Objective:   Vitals:   10/24/23 1443  BP: 110/68  Pulse: 93    Fetal Status: Fetal Heart Rate (bpm): 140   Movement: Present     General:  Alert, oriented and cooperative. Patient is in no acute distress.  Skin: Skin is warm and dry. No rash noted.   Cardiovascular: Normal heart rate noted  Respiratory: Normal respiratory effort, no problems with respiration noted  Abdomen: Soft, gravid, appropriate for gestational age.  Pain/Pressure: Present     Pelvic: Cervical exam deferred        Extremities: Normal range of motion.  Edema: Trace  Mental Status: Normal mood and affect. Normal behavior. Normal judgment and thought content.   Assessment and Plan:  Pregnancy: G1P0 at [redacted]w[redacted]d 1. [redacted] weeks gestation of pregnancy (Primary) 2. Supervision of high risk pregnancy in third trimester Resolved LGA, follow up scan scheduled.  No concerns.  Preterm labor symptoms and general obstetric precautions including but not limited to vaginal bleeding, contractions, leaking of fluid and fetal movement were reviewed in detail with the patient. Please refer to After Visit Summary for other counseling recommendations.   Return in  about 2 weeks (around 11/07/2023) for Pelvic cultures, OFFICE OB VISIT (MD only).  Future Appointments  Date Time Provider Department Center  11/01/2023 11:15 AM Abner Ables, MD [facility]  11/08/2023 10:55 AM Abner Ables, MD [facility]  11/15/2023 10:55 AM Raynell Caller, MD [facility]  11/18/2023 11:00 AM [facility] PROVIDER 1 [facility]  11/18/2023 11:30 AM [facility] US1 [facility]  11/22/2023 10:35 AM Daisey Dryer, Marine Sia, MD [facility]    Lenoard Rad, MD

## 2023-10-30 NOTE — Progress Notes (Signed)
 erroneous

## 2023-11-01 ENCOUNTER — Encounter: Admitting: Family Medicine

## 2023-11-04 ENCOUNTER — Encounter: Payer: Self-pay | Admitting: Obstetrics & Gynecology

## 2023-11-04 ENCOUNTER — Encounter: Payer: Self-pay | Admitting: Obstetrics and Gynecology

## 2023-11-05 ENCOUNTER — Encounter: Payer: Self-pay | Admitting: *Deleted

## 2023-11-07 ENCOUNTER — Other Ambulatory Visit (HOSPITAL_COMMUNITY)
Admission: RE | Admit: 2023-11-07 | Discharge: 2023-11-07 | Disposition: A | Discharge status: Home / Self Care | Source: Ambulatory Visit | Attending: Obstetrics & Gynecology | Admitting: Obstetrics & Gynecology

## 2023-11-07 ENCOUNTER — Encounter: Payer: Self-pay | Admitting: Obstetrics & Gynecology

## 2023-11-07 ENCOUNTER — Ambulatory Visit (INDEPENDENT_AMBULATORY_CARE_PROVIDER_SITE_OTHER): Admitting: Obstetrics & Gynecology

## 2023-11-07 VITALS — BP 113/71 | HR 91 | Wt 158.0 lb

## 2023-11-07 DIAGNOSIS — Z3A36 36 weeks gestation of pregnancy: Secondary | ICD-10-CM

## 2023-11-07 DIAGNOSIS — O0993 Supervision of high risk pregnancy, unspecified, third trimester: Secondary | ICD-10-CM

## 2023-11-07 NOTE — Patient Instructions (Signed)

## 2023-11-07 NOTE — Progress Notes (Signed)
   PRENATAL VISIT NOTE  Subjective:  Sandra Sullivan is a 23 y.o. G1P0 at [redacted]w[redacted]d being seen today for ongoing prenatal care.  She is currently monitored for the following issues for this low-risk pregnancy and has Supervision of high-risk pregnancy; Group B Streptococcus urinary tract infection affecting pregnancy, antepartum; and Inguinal hernia of right side, reducible without obstruction or gangrene on their problem list.  Patient reports no complaints.  Contractions: Not present. Vag. Bleeding: None.  Movement: Present. Denies leaking of fluid.   The following portions of the patient's history were reviewed and updated as appropriate: allergies, current medications, past family history, past medical history, past social history, past surgical history and problem list.   Objective:   Vitals:   11/07/23 0928  BP: 113/71  Pulse: 91  Weight: 158 lb (71.7 kg)    Fetal Status: Fetal Heart Rate (bpm): 151 Fundal Height: 37 cm Movement: Present  Presentation: Vertex (Verified on bedside ultrasound)      General: Alert, oriented and cooperative. Patient is in no acute distress.  Skin: Skin is warm and dry. No rash noted.   Cardiovascular: Normal heart rate noted  Respiratory: Normal respiratory effort, no problems with respiration noted  Abdomen: Soft, gravid, appropriate for gestational age.  Pain/Pressure: Present     Pelvic: Cervical exam deferred        Extremities: Normal range of motion.  Edema: Trace  Mental Status: Normal mood and affect. Normal behavior. Normal judgment and thought content.   Assessment and Plan:  Pregnancy: G1P0 at [redacted]w[redacted]d 1. [redacted] weeks gestation of pregnancy 2. Supervision of high risk pregnancy in third trimester (Primary) Self swab done - Cervicovaginal ancillary only Labor symptoms and general obstetric precautions including but not limited to vaginal bleeding, contractions, leaking of fluid and fetal movement were reviewed in detail with the  patient. Please refer to After Visit Summary for other counseling recommendations.   Return in about 1 week (around 11/14/2023) for OFFICE OB VISIT (MD or APP).  Future Appointments  Date Time Provider Department Center  11/14/2023  9:35 AM Pierra Skora, Kathrine Paris, MD [facility]  11/18/2023 11:00 AM [facility] PROVIDER 1 [facility]  11/18/2023 11:30 AM [facility] US1 [facility]  11/22/2023 10:35 AM Daisey Dryer, Marine Sia, MD [facility]    Lenoard Rad, MD

## 2023-11-08 ENCOUNTER — Encounter: Admitting: Family Medicine

## 2023-11-08 ENCOUNTER — Encounter: Payer: Self-pay | Admitting: Obstetrics & Gynecology

## 2023-11-08 LAB — CERVICOVAGINAL ANCILLARY ONLY
Chlamydia: NEGATIVE
Comment: NEGATIVE
Comment: NEGATIVE
Comment: NORMAL
Neisseria Gonorrhea: NEGATIVE
Trichomonas: NEGATIVE

## 2023-11-14 ENCOUNTER — Ambulatory Visit: Admitting: Obstetrics & Gynecology

## 2023-11-14 ENCOUNTER — Encounter: Payer: Self-pay | Admitting: Obstetrics & Gynecology

## 2023-11-14 VITALS — BP 109/75 | HR 91 | Wt 162.0 lb

## 2023-11-14 DIAGNOSIS — O0993 Supervision of high risk pregnancy, unspecified, third trimester: Secondary | ICD-10-CM

## 2023-11-14 DIAGNOSIS — Z3A37 37 weeks gestation of pregnancy: Secondary | ICD-10-CM | POA: Diagnosis not present

## 2023-11-14 NOTE — Progress Notes (Signed)
 ROB: denies any concerns

## 2023-11-14 NOTE — Patient Instructions (Signed)

## 2023-11-14 NOTE — Progress Notes (Signed)
 PRENATAL VISIT NOTE  Subjective:  Sandra Sullivan is a 23 y.o. G1P0 at [redacted]w[redacted]d being seen today for ongoing prenatal care.  She is currently monitored for the following issues for this high-risk pregnancy and has Supervision of high-risk pregnancy; Group B Streptococcus urinary tract infection affecting pregnancy, antepartum; and Inguinal hernia of right side, reducible without obstruction or gangrene on their problem list.  Patient reports no complaints.  Contractions: Not present. Vag. Bleeding: None.  Movement: Present. Denies leaking of fluid.   The following portions of the patient's history were reviewed and updated as appropriate: allergies, current medications, past family history, past medical history, past social history, past surgical history and problem list.   Objective:    Vitals:   11/14/23 1021  BP: 109/75  Pulse: 91  Weight: 162 lb (73.5 kg)    Fetal Status:  Fetal Heart Rate (bpm): 141 Fundal Height: 39 cm Movement: Present    General: Alert, oriented and cooperative. Patient is in no acute distress.  Skin: Skin is warm and dry. No rash noted.   Cardiovascular: Normal heart rate noted  Respiratory: Normal respiratory effort, no problems with respiration noted  Abdomen: Soft, gravid, appropriate for gestational age.  Pain/Pressure: Present     Pelvic: Cervical exam deferred        Extremities: Normal range of motion.  Edema: None  Mental Status: Normal mood and affect. Normal behavior. Normal judgment and thought content.    US  MFM OB FOLLOW UP Result Date: 10/07/2023 ----------------------------------------------------------------------  OBSTETRICS REPORT                       (Signed Final 10/07/2023 02:55 pm) ---------------------------------------------------------------------- Patient Info  ID #:       [id number]                          D.O.B.:  18-Aug-2000 (22 yrs)(F)  Name:       Sandra Sullivan               Visit Date: 10/07/2023 11:37 am  ---------------------------------------------------------------------- Performed By  Attending:        Cassandria Clever MD        Ref. Address:     [address]                                                             [address]  Performed By:     Elspeth Hals       [facility]  Referred By:      [facility] Roda Cirri ---------------------------------------------------------------------- Orders  #  Description                           Code        Ordered By  1  US  MFM OB FOLLOW UP                   16109.60    Penney Bowling ----------------------------------------------------------------------  #  Order #                     Accession #                Episode #  1  [id number]                   [id number]                 [id number] ---------------------------------------------------------------------- Indications  Large for gestational age fetus affecting      O36.60X0  management of mother (resolved)  Encounter for other antenatal screening        Z36.2  follow-up  [redacted] weeks gestation of pregnancy                Z3A.32  2hr GTT WNL ---------------------------------------------------------------------- Vital Signs  BP:          119/71 ---------------------------------------------------------------------- Fetal Evaluation  Num Of Fetuses:         1  Fetal Heart Rate(bpm):  148  Cardiac Activity:       Observed  Presentation:           Cephalic  Placenta:               Anterior  P. Cord Insertion:      Marg insertion previously seen  Amniotic Fluid  AFI FV:      Within normal limits  AFI Sum(cm)     %Tile       Largest Pocket(cm)  12.45           35          6.63  RUQ(cm)       RLQ(cm)       LUQ(cm)        LLQ(cm)  2.78          0.97          6.63           2.07  ---------------------------------------------------------------------- Biometry  BPD:      81.5  mm     G. Age:  32w 5d         52  %    CI:        76.05   %    70 - 86  FL/HC:      21.0   %    19.1 - 21.3  HC:      296.2  mm     G. Age:  32w 5d         21  %    HC/AC:      0.97        0.96 - 1.17  AC:      306.3  mm     G. Age:  34w 4d         95  %    FL/BPD:     76.2   %    71 - 87  FL:       62.1  mm     G. Age:  32w 1d         31  %    FL/AC:      20.3   %    20 - 24  Est. FW:    2221  gm    4 lb 14 oz      76  % ---------------------------------------------------------------------- OB History  Gravidity:    1         Term:   0        Prem:   0        SAB:   0  TOP:          0       Ectopic:  0        Living: 0 ---------------------------------------------------------------------- Gestational Age  U/S Today:     33w 0d                                        EDD:   11/25/23  Best:          32w 3d     Det. By:  U/S C R L  (05/03/23)    EDD:   11/29/23 ---------------------------------------------------------------------- Anatomy  Cranium:               Previously seen        Aortic Arch:            Previously seen  Cavum:                 Previously seen        Ductal Arch:            Previously seen  Ventricles:            Previously seen        Diaphragm:              Appears normal  Choroid Plexus:        Previously seen        Stomach:                Appears normal, left                                                                        sided  Cerebellum:            Previously seen  Abdomen:                Previously seen  Posterior Fossa:       Previously seen        Abdominal Wall:         Previously seen  Face:                  Orbits and profile     Cord Vessels:           Previously seen                         previously seen  Lips:                  Previously seen        Kidneys:                Appear normal  Thoracic:               Previously seen        Bladder:                Appears normal  Heart:                 Appears normal         Spine:                  Previously seen                         (4CH, axis, and                         situs)  RVOT:                  Previously seen        Upper Extremities:      Previously seen  LVOT:                  Previously seen        Lower Extremities:      Previously seen  Other:  Female gender previously seen. Fetal anatomic survey complete on          prior scans. ---------------------------------------------------------------------- Impression  G1 P0 at 32w 4d gestation.  Patient returned for fetal growth assessment.  Marginal cord insertion and large for gestational age fetus  were seen at previous ultrasound.  On today's ultrasound, amniotic fluid is normal good fetal  activity seen.  Fetal growth is appropriate for gestational age.  The estimated fetal weight is at the 76 percentile and the  abdominal circumference measurement at the 95th  percentile.  Marginal cord insertion is seen again.  I reassured the patient of normal fetal growth assessment.  I explained that ultrasound has limitations in accurately  estimating fetal weights.  Even if macrosomia is seen, early  term delivery (37 or 38 weeks) will not be advised.  Patient does not have gestational diabetes. ---------------------------------------------------------------------- Recommendations  -An appointment was made for her to return in 6 weeks for  fetal growth assessment (estimation of fetal weight before  delivery). ----------------------------------------------------------------------                 Cassandria Clever, MD Electronically Signed Final Report   10/07/2023 02:55 pm ----------------------------------------------------------------------     Assessment and Plan:  Pregnancy: G1P0 at [redacted]w[redacted]d 1. [redacted] weeks  gestation of pregnancy 2. Supervision of high risk pregnancy in third trimester (Primary) Follow up growth scan on  11/18/23, will follow up results and manage accordingly. Labor symptoms and general obstetric precautions including but not limited to vaginal bleeding, contractions, leaking of fluid and fetal movement were reviewed in detail with the patient. Please refer to After Visit Summary for other counseling recommendations.   Return in about 1 week (around 11/21/2023) for OFFICE OB VISIT (MD only).  Future Appointments  Date Time Provider Department Center  11/18/2023 11:00 AM [facility] PROVIDER 1 [facility]  11/18/2023 11:30 AM [facility] US1 [facility]  11/22/2023 10:35 AM Daisey Dryer, Marine Sia, MD [facility]    Lenoard Rad, MD

## 2023-11-15 ENCOUNTER — Encounter: Admitting: Obstetrics and Gynecology

## 2023-11-18 ENCOUNTER — Ambulatory Visit

## 2023-11-18 ENCOUNTER — Ambulatory Visit (HOSPITAL_BASED_OUTPATIENT_CLINIC_OR_DEPARTMENT_OTHER): Admitting: Obstetrics and Gynecology

## 2023-11-18 DIAGNOSIS — Z3A38 38 weeks gestation of pregnancy: Secondary | ICD-10-CM

## 2023-11-18 DIAGNOSIS — Z3689 Encounter for other specified antenatal screening: Secondary | ICD-10-CM | POA: Insufficient documentation

## 2023-11-18 DIAGNOSIS — O3663X0 Maternal care for excessive fetal growth, third trimester, not applicable or unspecified: Secondary | ICD-10-CM | POA: Diagnosis not present

## 2023-11-18 NOTE — Progress Notes (Signed)
 After review, MFM consult with provider is not indicated for today  Sandra Clever, MD 11/18/2023 11:26 AM  [facility]

## 2023-11-19 ENCOUNTER — Encounter (HOSPITAL_COMMUNITY): Payer: Self-pay | Admitting: Obstetrics & Gynecology

## 2023-11-19 ENCOUNTER — Inpatient Hospital Stay (HOSPITAL_COMMUNITY)
Admission: AD | Admit: 2023-11-19 | Discharge: 2023-11-21 | DRG: 807 | Disposition: A | Discharge status: Home / Self Care | Attending: Obstetrics & Gynecology | Admitting: Obstetrics & Gynecology

## 2023-11-19 ENCOUNTER — Inpatient Hospital Stay (HOSPITAL_COMMUNITY): Admitting: Anesthesiology

## 2023-11-19 DIAGNOSIS — O26893 Other specified pregnancy related conditions, third trimester: Secondary | ICD-10-CM | POA: Diagnosis present

## 2023-11-19 DIAGNOSIS — O43193 Other malformation of placenta, third trimester: Principal | ICD-10-CM | POA: Diagnosis present

## 2023-11-19 DIAGNOSIS — O99824 Streptococcus B carrier state complicating childbirth: Secondary | ICD-10-CM | POA: Diagnosis present

## 2023-11-19 DIAGNOSIS — K4190 Unilateral femoral hernia, without obstruction or gangrene, not specified as recurrent: Secondary | ICD-10-CM | POA: Diagnosis present

## 2023-11-19 DIAGNOSIS — O4423 Partial placenta previa NOS or without hemorrhage, third trimester: Secondary | ICD-10-CM | POA: Diagnosis not present

## 2023-11-19 DIAGNOSIS — O9962 Diseases of the digestive system complicating childbirth: Secondary | ICD-10-CM | POA: Diagnosis present

## 2023-11-19 DIAGNOSIS — O99892 Other specified diseases and conditions complicating childbirth: Secondary | ICD-10-CM | POA: Diagnosis present

## 2023-11-19 DIAGNOSIS — O99820 Streptococcus B carrier state complicating pregnancy: Secondary | ICD-10-CM | POA: Diagnosis not present

## 2023-11-19 DIAGNOSIS — K219 Gastro-esophageal reflux disease without esophagitis: Secondary | ICD-10-CM | POA: Diagnosis present

## 2023-11-19 DIAGNOSIS — Z3A38 38 weeks gestation of pregnancy: Secondary | ICD-10-CM

## 2023-11-19 DIAGNOSIS — B951 Streptococcus, group B, as the cause of diseases classified elsewhere: Secondary | ICD-10-CM | POA: Diagnosis present

## 2023-11-19 DIAGNOSIS — K4090 Unilateral inguinal hernia, without obstruction or gangrene, not specified as recurrent: Secondary | ICD-10-CM | POA: Diagnosis present

## 2023-11-19 LAB — CBC
HCT: 34.6 % — ABNORMAL LOW (ref 36.0–46.0)
Hemoglobin: 11 g/dL — ABNORMAL LOW (ref 12.0–15.0)
MCH: 28.4 pg (ref 26.0–34.0)
MCHC: 31.8 g/dL (ref 30.0–36.0)
MCV: 89.4 fL (ref 80.0–100.0)
Platelets: 210 10*3/uL (ref 150–400)
RBC: 3.87 MIL/uL (ref 3.87–5.11)
RDW: 14.6 % (ref 11.5–15.5)
WBC: 13.3 10*3/uL — ABNORMAL HIGH (ref 4.0–10.5)
nRBC: 0 % (ref 0.0–0.2)

## 2023-11-19 LAB — TYPE AND SCREEN
ABO/RH(D): O POS
Antibody Screen: NEGATIVE

## 2023-11-19 LAB — RPR: RPR Ser Ql: NONREACTIVE

## 2023-11-19 MED ORDER — ONDANSETRON HCL 4 MG/2ML IJ SOLN
4.0000 mg | Freq: Four times a day (QID) | INTRAMUSCULAR | Status: DC | PRN
  Administered 2023-11-19: 4 mg via INTRAVENOUS
  Filled 2023-11-19: qty 2

## 2023-11-19 MED ORDER — DIBUCAINE (PERIANAL) 1 % EX OINT: 1.0000 | TOPICAL_OINTMENT | CUTANEOUS | Status: DC | PRN

## 2023-11-19 MED ORDER — SODIUM CHLORIDE 0.9% FLUSH
3.0000 mL | Freq: Two times a day (BID) | INTRAVENOUS | Status: DC
Start: 2023-11-20 — End: 2023-11-20

## 2023-11-19 MED ORDER — PRENATAL MULTIVITAMIN CH
1.0000 | ORAL_TABLET | Freq: Every day | ORAL | Status: DC
  Administered 2023-11-20 – 2023-11-21 (×2): 1 via ORAL
  Filled 2023-11-19 (×2): qty 1

## 2023-11-19 MED ORDER — LIDOCAINE HCL (PF) 1 % IJ SOLN: 30.0000 mL | INTRAMUSCULAR | Status: DC | PRN

## 2023-11-19 MED ORDER — EPHEDRINE 5 MG/ML INJ: 10.0000 mg | INTRAVENOUS | Status: DC | PRN

## 2023-11-19 MED ORDER — OXYTOCIN-SODIUM CHLORIDE 30-0.9 UT/500ML-% IV SOLN
2.5000 [IU]/h | INTRAVENOUS | Status: DC
  Filled 2023-11-19: qty 500

## 2023-11-19 MED ORDER — ACETAMINOPHEN 325 MG PO TABS: 650.0000 mg | ORAL_TABLET | ORAL | Status: DC | PRN

## 2023-11-19 MED ORDER — PHENYLEPHRINE 80 MCG/ML (10ML) SYRINGE FOR IV PUSH (FOR BLOOD PRESSURE SUPPORT): 80.0000 ug | PREFILLED_SYRINGE | INTRAVENOUS | Status: DC | PRN

## 2023-11-19 MED ORDER — OXYCODONE-ACETAMINOPHEN 5-325 MG PO TABS: 1.0000 | ORAL_TABLET | ORAL | Status: DC | PRN

## 2023-11-19 MED ORDER — FENTANYL CITRATE (PF) 100 MCG/2ML IJ SOLN
50.0000 ug | INTRAMUSCULAR | Status: DC | PRN
  Administered 2023-11-19 (×4): 100 ug via INTRAVENOUS
  Filled 2023-11-19 (×3): qty 2

## 2023-11-19 MED ORDER — LACTATED RINGERS IV SOLN: INTRAVENOUS | Status: DC

## 2023-11-19 MED ORDER — FLEET ENEMA RE ENEM: 1.0000 | ENEMA | RECTAL | Status: DC | PRN

## 2023-11-19 MED ORDER — MEASLES, MUMPS & RUBELLA VAC IJ SOLR
0.5000 mL | Freq: Once | INTRAMUSCULAR | Status: DC
  Filled 2023-11-19: qty 0.5

## 2023-11-19 MED ORDER — SENNOSIDES-DOCUSATE SODIUM 8.6-50 MG PO TABS
2.0000 | ORAL_TABLET | ORAL | Status: DC
  Administered 2023-11-20 – 2023-11-21 (×2): 2 via ORAL
  Filled 2023-11-19 (×2): qty 2

## 2023-11-19 MED ORDER — PENICILLIN G POT IN DEXTROSE 60000 UNIT/ML IV SOLN
3.0000 10*6.[IU] | INTRAVENOUS | Status: DC
  Administered 2023-11-19 (×3): 3 10*6.[IU] via INTRAVENOUS
  Filled 2023-11-19 (×6): qty 50

## 2023-11-19 MED ORDER — TETANUS-DIPHTH-ACELL PERTUSSIS 5-2.5-18.5 LF-MCG/0.5 IM SUSY
0.5000 mL | PREFILLED_SYRINGE | Freq: Once | INTRAMUSCULAR | Status: DC
Start: 2023-11-20 — End: 2023-11-21
  Filled 2023-11-19: qty 0.5

## 2023-11-19 MED ORDER — OXYCODONE-ACETAMINOPHEN 5-325 MG PO TABS: 2.0000 | ORAL_TABLET | ORAL | Status: DC | PRN

## 2023-11-19 MED ORDER — CALCIUM CARBONATE ANTACID 500 MG PO CHEW
1.0000 | CHEWABLE_TABLET | Freq: Four times a day (QID) | ORAL | Status: DC | PRN
  Administered 2023-11-19 (×2): 400 mg via ORAL
  Filled 2023-11-19 (×2): qty 2

## 2023-11-19 MED ORDER — SIMETHICONE 80 MG PO CHEW: 80.0000 mg | CHEWABLE_TABLET | ORAL | Status: DC | PRN

## 2023-11-19 MED ORDER — OXYTOCIN BOLUS FROM INFUSION
333.0000 mL | Freq: Once | INTRAVENOUS | Status: AC
  Administered 2023-11-19: 333 mL via INTRAVENOUS

## 2023-11-19 MED ORDER — SODIUM CHLORIDE 0.9 % IV SOLN: 250.0000 mL | INTRAVENOUS | Status: DC | PRN

## 2023-11-19 MED ORDER — IBUPROFEN 600 MG PO TABS
600.0000 mg | ORAL_TABLET | Freq: Four times a day (QID) | ORAL | Status: DC
  Administered 2023-11-20 – 2023-11-21 (×7): 600 mg via ORAL
  Filled 2023-11-19 (×7): qty 1

## 2023-11-19 MED ORDER — DIPHENHYDRAMINE HCL 50 MG/ML IJ SOLN: 12.5000 mg | INTRAMUSCULAR | Status: DC | PRN

## 2023-11-19 MED ORDER — SODIUM CHLORIDE 0.9 % IV SOLN
5.0000 10*6.[IU] | Freq: Once | INTRAVENOUS | Status: AC
  Administered 2023-11-19: 5 10*6.[IU] via INTRAVENOUS
  Filled 2023-11-19: qty 5

## 2023-11-19 MED ORDER — ONDANSETRON HCL 4 MG PO TABS: 4.0000 mg | ORAL_TABLET | ORAL | Status: DC | PRN

## 2023-11-19 MED ORDER — FENTANYL CITRATE (PF) 100 MCG/2ML IJ SOLN
INTRAMUSCULAR | Status: AC
  Filled 2023-11-19: qty 2

## 2023-11-19 MED ORDER — ZOLPIDEM TARTRATE 5 MG PO TABS: 5.0000 mg | ORAL_TABLET | Freq: Every evening | ORAL | Status: DC | PRN

## 2023-11-19 MED ORDER — COCONUT OIL OIL: 1.0000 | TOPICAL_OIL | Status: DC | PRN

## 2023-11-19 MED ORDER — LACTATED RINGERS IV SOLN: 500.0000 mL | INTRAVENOUS | Status: DC | PRN

## 2023-11-19 MED ORDER — LIDOCAINE HCL (PF) 1 % IJ SOLN
INTRAMUSCULAR | Status: DC | PRN
  Administered 2023-11-19: 11 mL via EPIDURAL

## 2023-11-19 MED ORDER — LACTATED RINGERS IV SOLN
500.0000 mL | Freq: Once | INTRAVENOUS | Status: AC
  Administered 2023-11-19: 500 mL via INTRAVENOUS

## 2023-11-19 MED ORDER — DIPHENHYDRAMINE HCL 25 MG PO CAPS: 25.0000 mg | ORAL_CAPSULE | Freq: Four times a day (QID) | ORAL | Status: DC | PRN

## 2023-11-19 MED ORDER — BENZOCAINE-MENTHOL 20-0.5 % EX AERO: 1.0000 | INHALATION_SPRAY | CUTANEOUS | Status: DC | PRN

## 2023-11-19 MED ORDER — TERBUTALINE SULFATE 1 MG/ML IJ SOLN
INTRAMUSCULAR | Status: AC
Start: 2023-11-19 — End: 2023-11-20
  Filled 2023-11-19: qty 1

## 2023-11-19 MED ORDER — PHENYLEPHRINE 80 MCG/ML (10ML) SYRINGE FOR IV PUSH (FOR BLOOD PRESSURE SUPPORT)
80.0000 ug | PREFILLED_SYRINGE | INTRAVENOUS | Status: DC | PRN
  Administered 2023-11-19: 80 ug via INTRAVENOUS
  Filled 2023-11-19: qty 10

## 2023-11-19 MED ORDER — FENTANYL-BUPIVACAINE-NACL 0.5-0.125-0.9 MG/250ML-% EP SOLN
12.0000 mL/h | EPIDURAL | Status: DC | PRN
  Administered 2023-11-19: 12 mL/h via EPIDURAL
  Filled 2023-11-19: qty 250

## 2023-11-19 MED ORDER — WITCH HAZEL-GLYCERIN EX PADS: 1.0000 | MEDICATED_PAD | CUTANEOUS | Status: DC | PRN

## 2023-11-19 MED ORDER — ERYTHROMYCIN 5 MG/GM OP OINT
TOPICAL_OINTMENT | OPHTHALMIC | Status: AC
  Filled 2023-11-19: qty 1

## 2023-11-19 MED ORDER — ONDANSETRON HCL 4 MG/2ML IJ SOLN: 4.0000 mg | INTRAMUSCULAR | Status: DC | PRN

## 2023-11-19 MED ORDER — SOD CITRATE-CITRIC ACID 500-334 MG/5ML PO SOLN
30.0000 mL | ORAL | Status: DC | PRN
  Administered 2023-11-19: 30 mL via ORAL
  Filled 2023-11-19: qty 30

## 2023-11-19 MED ORDER — SODIUM CHLORIDE 0.9% FLUSH: 3.0000 mL | INTRAVENOUS | Status: DC | PRN

## 2023-11-19 NOTE — Anesthesia Preprocedure Evaluation (Addendum)
 Anesthesia Evaluation  Patient identified by MRN, date of birth, ID band Patient awake    Reviewed: Allergy & Precautions, H&P , NPO status , Patient's Chart, lab work & pertinent test results  Airway Mallampati: II  TM Distance: >3 FB Neck ROM: Full    Dental no notable dental hx.    Pulmonary neg pulmonary ROS   Pulmonary exam normal breath sounds clear to auscultation       Cardiovascular negative cardio ROS Normal cardiovascular exam Rhythm:Regular Rate:Normal     Neuro/Psych negative neurological ROS  negative psych ROS   GI/Hepatic negative GI ROS, Neg liver ROS,,,  Endo/Other  negative endocrine ROS    Renal/GU negative Renal ROS  negative genitourinary   Musculoskeletal negative musculoskeletal ROS (+)    Abdominal   Peds negative pediatric ROS (+)  Hematology negative hematology ROS (+)   Anesthesia Other Findings   Reproductive/Obstetrics (+) Pregnancy                             Anesthesia Physical Anesthesia Plan  ASA: 2  Anesthesia Plan: Epidural   Post-op Pain Management:    Induction:   PONV Risk Score and Plan:   Airway Management Planned:   Additional Equipment:   Intra-op Plan:   Post-operative Plan:   Informed Consent:   Plan Discussed with:   Anesthesia Plan Comments:        Anesthesia Quick Evaluation

## 2023-11-19 NOTE — Progress Notes (Addendum)
 Labor Progress Note Sandra Sullivan is a 23 y.o. G1P0 at [redacted]w[redacted]d presented for SOL  S:  Coping well with breathing and nitrous  O:  BP 127/68   Pulse 88   Temp 98 F (36.7 C) (Oral)   Resp 19   Ht 5\' 3"  (1.6 m)   Wt 73.3 kg   LMP 02/05/2023 (Within Days)   SpO2 100%   BMI 28.63 kg/m  EFM: baseline 140 bpm/ moderate variability/ 15x15 accels/ a decels  Toco/IUPC: absent SVE: Dilation: 5 Effacement (%): 90 Cervical Position: Posterior Station: -2 Presentation: Vertex Exam by:: Alondra Twitty RN Pitocin: 0 mu/min  A/P: 23 y.o. G1P0 [redacted]w[redacted]d SOL 1. Labor: SOL with anticipated entry to active phase imminently. Expectant management for now. Patient amenable to AROM once PCN adequate 2. FWB: Cat 1 3. Pain: Coping well 4. GBS pos, tx x 1   Anticipate SVB.  Salomon Cree, CNM 7:26 AM

## 2023-11-19 NOTE — Discharge Summary (Signed)
 Postpartum Discharge Summary     Patient Name: Sandra Sullivan DOB: 2001/05/20 MRN: [id number]  Date of admission: 11/19/2023 Delivery date:11/19/2023 Delivering provider: CHUBB, CASEY C Date of discharge: 11/21/2023  Admitting diagnosis: Spontaneous vaginal delivery [O80] Normal labor and delivery [O80] Intrauterine pregnancy: [redacted]w[redacted]d     Secondary diagnosis:  Principal Problem:   Spontaneous vaginal delivery Active Problems:   Group B Streptococcus urinary tract infection affecting pregnancy, antepartum   Inguinal hernia of right side, reducible without obstruction or gangrene   Normal labor and delivery  Additional problems: none    Discharge diagnosis: Term Pregnancy Delivered and R femoral hernia                                              Post partum procedures:none Augmentation: AROM Complications: None  Hospital course: Onset of Labor With Vaginal Delivery      23 y.o. yo G1P1001 at [redacted]w[redacted]d was admitted in Latent Labor on 11/19/2023. Labor course was uncomplicated.   Membrane Rupture Time/Date: 1:26 PM,11/19/2023  Delivery Method:Vaginal, Spontaneous Operative Delivery:N/A Episiotomy: None Lacerations:  1st degree Patient had an uncomplicated postpartum course.  She is ambulating, tolerating a regular diet, passing flatus, and urinating well. Patient is discharged home in stable condition on 11/21/23.  Newborn Data: Birth date:11/19/2023 Birth time:9:42 PM Gender:Female Living status:Living Apgars:9 ,9  Weight:3240 g (7lb 2.3oz)  Magnesium Sulfate received: No BMZ received: No Rhophylac:N/A MMR:N/A T-DaP:declined prenatally Flu: No RSV Vaccine received: No Transfusion:No  Immunizations received: There is no immunization history for the selected administration types on file for this patient.  Physical exam  Vitals:   11/20/23 0900 11/20/23 1300 11/20/23 1946 11/21/23 0530  BP: 111/67 116/71 117/73 113/72  Pulse: 80 72 100 72  Resp: 18 18 18 18   Temp:  98.2 F (36.8 C) 98.6 F (37 C) 98.3 F (36.8 C) 97.8 F (36.6 C)  TempSrc: Oral Oral Oral Oral  SpO2: 100% 100% 100% 100%  Weight:      Height:       General: alert and cooperative Lochia: appropriate Uterine Fundus: firm Incision: N/A DVT Evaluation: No evidence of DVT seen on physical exam. Labs: Lab Results  Component Value Date   WBC 14.7 (H) 11/20/2023   HGB 9.2 (L) 11/20/2023   HCT 28.5 (L) 11/20/2023   MCV 88.0 11/20/2023   PLT 191 11/20/2023      Latest Ref Rng & Units 10/05/2015    1:38 PM  CMP  Glucose 65 - 99 mg/dL 95   BUN 6 - 20 mg/dL 10   Creatinine 6.44 - 1.00 mg/dL 0.34   Sodium 742 - 595 mmol/L 138   Potassium 3.5 - 5.1 mmol/L 3.7   Chloride 101 - 111 mmol/L 104   CO2 22 - 32 mmol/L 25   Calcium 8.9 - 10.3 mg/dL 9.4   Total Protein 6.5 - 8.1 g/dL 7.0   Total Bilirubin 0.3 - 1.2 mg/dL 0.3   Alkaline Phos 51 - 332 U/L 265   AST 15 - 41 U/L 26   ALT 14 - 54 U/L 14    Edinburgh Score:    11/20/2023    9:00 AM  Edinburgh Postnatal Depression Scale Screening Tool  I have been able to laugh and see the funny side of things. 0  I have looked forward with enjoyment to things. 0  I have blamed myself unnecessarily when things went wrong. 1  I have been anxious or worried for no good reason. 2  I have felt scared or panicky for no good reason. 0  Things have been getting on top of me. 1  I have been so unhappy that I have had difficulty sleeping. 0  I have felt sad or miserable. 1  I have been so unhappy that I have been crying. 0  The thought of harming myself has occurred to me. 0  Edinburgh Postnatal Depression Scale Total 5   Edinburgh Postnatal Depression Scale Total: 5   After visit meds:  Allergies as of 11/21/2023   No Known Allergies      Medication List     TAKE these medications    ibuprofen  600 MG tablet Commonly known as: ADVIL  Take 1 tablet (600 mg total) by mouth every 6 (six) hours as needed.   multivitamin-prenatal  27-0.8 MG Tabs tablet Take 1 tablet by mouth daily at 12 noon.         Discharge home in stable condition Infant Feeding: Breast Infant Disposition:home with mother Discharge instruction: per After Visit Summary and Postpartum booklet. Activity: Advance as tolerated. Pelvic rest for 6 weeks.  Diet: routine diet Future Appointments: Future Appointments  Date Time Provider Department Center  01/01/2024 10:55 AM Tari Fare, CNM [facility]   Follow up Visit: Message to [facility] 5/21  Please schedule this patient for a In person postpartum visit in 4 weeks with the following provider: Any provider. Additional Postpartum F/U:R femoral hernia   Low risk pregnancy complicated by: R femoral hernia  Delivery mode:  Vaginal, Spontaneous Anticipated Birth Control:  Patch   11/21/2023 Jolayne Natter, CNM 7:06 AM

## 2023-11-19 NOTE — Progress Notes (Signed)
 Labor Progress Note Sandra Sullivan is a 23 y.o. G1P0 at [redacted]w[redacted]d presented for SOL  S: In room due to decel down to 90s. Just got epidural.  O:  BP (!) 142/82   Pulse 72   Temp 98.4 F (36.9 C) (Oral)   Resp 18   Ht 5\' 3"  (1.6 m)   Wt 73.3 kg   LMP 02/05/2023 (Within Days)   SpO2 100%   BMI 28.63 kg/m  EFM: 130/mod/+a/-d  CVE: Dilation: 8 Effacement (%): 100 Cervical Position: Posterior Station: -1 Presentation: Vertex Exam by:: Dalaina Tates   A&P: 23 y.o. G1P0 [redacted]w[redacted]d here for SOL  #Labor: Progressing well. Some improvement in station, more cervix anterior than posterior, still feels OP. Prolonged decel ~3-4 min, improved w Phenylephrine, positional changes, and bolus. Suspect 2/2 recent epidural and ?hypotension, though not reflected in BP readings. #Pain: Epidural #FWB: Cat I now, had prolonged decel -- ~4 min, mod variability throughout #GBS positive > PCN  Melanie Spires, MD 7:10 PM

## 2023-11-19 NOTE — H&P (Signed)
 OBSTETRIC ADMISSION HISTORY AND PHYSICAL  Sandra Sullivan is a 23 y.o. female G1P0 with IUP at [redacted]w[redacted]d by US  at 10 weeks presenting for SOL. She reports +FMs, No LOF, no VB, no blurry vision, headaches or peripheral edema, and RUQ pain.  She plans on breast feeding. She desires the patch for contraception method, counseled on options and contraindications in the postpartum and breastfeeding period.  She received her prenatal care at [facility]   Dating: By 10 week US  --->  Estimated Date of Delivery: 11/29/23  Sono:    @[redacted]w[redacted]d , CWD, normal anatomy, cephalic presentation,anterior placental lie, 2221g, 76% EFW   Prenatal History/Complications: R sided femoral hernia, marginal cord insertion  Past Medical History: Past Medical History:  Diagnosis Date   ADHD (attention deficit hyperactivity disorder)    Seasonal allergies     Past Surgical History: History reviewed. No pertinent surgical history.  Obstetrical History: OB History     Gravida  1   Para      Term      Preterm      AB      Living         SAB      IAB      Ectopic      Multiple      Live Births              Social History Social History   Socioeconomic History   Marital status: Single    Spouse name: Not on file   Number of children: Not on file   Years of education: Not on file   Highest education level: Not on file  Occupational History   Not on file  Tobacco Use   Smoking status: Never    Passive exposure: Never   Smokeless tobacco: Never  Vaping Use   Vaping status: Never Used  Substance and Sexual Activity   Alcohol use: No   Drug use: No   Sexual activity: Not Currently    Birth control/protection: Condom  Other Topics Concern   Not on file  Social History Narrative   Lives with mom, stepdad and siblings. She is in the 12th grade at [employer]   Social Drivers of Health   Financial Resource Strain: Not on file  Food Insecurity: Not on file  Transportation Needs: Not on file   Physical Activity: Not on file  Stress: Not on file  Social Connections: Unknown (10/17/2022)   Received from [facility]   Social Network    Social Network: Not on file    Family History: Family History  Problem Relation Age of Onset   Migraines Mother    Anxiety disorder Maternal Aunt    Depression Maternal Aunt    Autism Neg Hx    ADD / ADHD Neg Hx    Bipolar disorder Neg Hx     Allergies: No Known Allergies  Medications Prior to Admission  Medication Sig Dispense Refill Last Dose/Taking   Prenatal Vit-Fe Fumarate-FA (MULTIVITAMIN-PRENATAL) 27-0.8 MG TABS tablet Take 1 tablet by mouth daily at 12 noon.   11/18/2023     Review of Systems   All systems reviewed and negative except as stated in HPI  Blood pressure 114/80, pulse 94, temperature 98.4 F (36.9 C), temperature source Oral, resp. rate 19, height 5\' 3"  (1.6 m), weight 73.3 kg, last menstrual period 02/05/2023, SpO2 100%. General appearance: alert, cooperative, and appears stated age Lungs: clear to auscultation bilaterally Heart: regular rate and rhythm  Abdomen: soft, non-tender; bowel sounds normal Pelvic: deferred Extremities: Homans sign is negative, no sign of DVT DTR's +2 Presentation: cephalic Fetal monitoringBaseline: 135 bpm, Variability: Good {> 6 bpm), and Accelerations: Reactive Uterine activityFrequency: Every 3-5 minutes, Duration: 60-90 seconds, and Intensity: strong Dilation: 5 Effacement (%): 90 Station: -2 Exam by:: Gae Jointer RN   Prenatal labs: ABO, Rh: --/--/PENDING (05/20 0518) Antibody: PENDING (05/20 0518) Rubella: 2.73 (11/01 1208) RPR: Non Reactive (02/17 0915)  HBsAg: Negative (11/01 1208)  HIV: Non Reactive (02/17 0915)  GBS:     No results found for: "GBS" GTT WNL Genetic screening  LR female Anatomy US  WNL, marginal cord insertion, followed by MFM   There is no immunization history on file for this patient.  Prenatal Transfer Tool  Maternal  Diabetes: No Genetic Screening: Normal Maternal Ultrasounds/Referrals: Other: marginal cord insertion Fetal Ultrasounds or other Referrals:  Referred to Materal Fetal Medicine  Maternal Substance Abuse:  No Significant Maternal Medications:  None Significant Maternal Lab Results: Group B Strep positive Number of Prenatal Visits:greater than 3 verified prenatal visits Maternal Vaccinations:Covid Other Comments:  None   Results for orders placed or performed during the hospital encounter of 11/19/23 (from the past 24 hours)  Type and screen [facility]   Collection Time: 11/19/23  5:18 AM  Result Value Ref Range   ABO/RH(D) PENDING    Antibody Screen PENDING    Sample Expiration      11/22/2023,2359 Performed at [facility], [address], [place], [place] [address]     Patient Active Problem List   Diagnosis Date Noted   Inguinal hernia of right side, reducible without obstruction or gangrene 07/18/2023   Group B Streptococcus urinary tract infection affecting pregnancy, antepartum 05/07/2023   Supervision of high-risk pregnancy 05/03/2023    Assessment/Plan:  Sandra Sullivan is a 23 y.o. G1P0 at [redacted]w[redacted]d here for SOL  #Labor:SOL with progression from 3 to 5 in MAU.  #Pain: Epidural on request #FWB: Cat 1 #GBS status:  Positive, plan treatment with PCN #Feeding: Breastmilk  #Reproductive Life planning: Patch #Circ:  yes  Salomon Cree, CNM  11/19/2023, 5:32 AM

## 2023-11-19 NOTE — MAU Note (Signed)
 MAU Labor Triage Note: .[name] is a 23 y.o. at [redacted]w[redacted]d here in MAU reporting:  Contractions every: 9 minutes Onset of ctx: 1100 on 11/15/2023    ROM: intact Vaginal Bleeding: none Last SVE: none Labor Pain Management Plan: Undecided  Fetal Movement: Reports positive FM FHT: 142   Vitals:   11/19/23 0319  BP: 114/80  Pulse: 94  Resp: 19  Temp: 98.4 F (36.9 C)  SpO2: 100%     OB Office: Faculty GBS: Positive Lab orders placed from triage: MAU Labor Eval

## 2023-11-19 NOTE — Progress Notes (Signed)
 Labor progress note  In to check on pt, pt desires AROM. Risks, benefits of AROM discussed, pt provided verbal consent. AROM performed w scant amount of clear fluid. Following AROM, baby had early decel, otherwise cat I tracing. She was inquiring about Pitocin, however given cervical change with her ctx, would defer for now. Anticipate SVD.  Melanie Spires, MD OB Fellow, Faculty Practice [facility], Center for [facility]

## 2023-11-19 NOTE — Progress Notes (Signed)
 Labor progress note  In to see pt -- pt had requested multiple doses of Fentanyl, has not wanted to change positions 2/2 pain. I discussed options for epidural vs nitrous mask vs unmedicated. She desires epidural, RN working on arranging for pt. Encouraged positional changes given ctx appear to be coupling to help get baby in better position for pushing. Cat I tracing. Contracting regularly.   Melanie Spires, MD OB Fellow, Faculty Practice [facility], Center for [facility]

## 2023-11-19 NOTE — Anesthesia Procedure Notes (Signed)
 Epidural Patient location during procedure: OB Start time: 11/19/2023 6:27 PM End time: 11/19/2023 6:58 PM  Staffing Anesthesiologist: Earvin Goldberg, MD Performed: anesthesiologist   Preanesthetic Checklist Completed: patient identified, IV checked, site marked, risks and benefits discussed, surgical consent, monitors and equipment checked, pre-op evaluation and timeout performed  Epidural Patient position: sitting Prep: ChloraPrep Patient monitoring: heart rate, cardiac monitor, continuous pulse ox and blood pressure Approach: midline Location: L2-L3 Injection technique: LOR saline  Needle:  Needle type: Tuohy  Needle gauge: 17 G Needle length: 9 cm Needle insertion depth: 6 cm Catheter type: closed end flexible Catheter size: 20 Guage Catheter at skin depth: 10 cm Test dose: negative  Assessment Events: blood not aspirated, injection not painful, no injection resistance, no paresthesia and negative IV test  Additional Notes Reason for block:procedure for pain

## 2023-11-19 NOTE — Progress Notes (Signed)
 Labor Progress Note Sandra Sullivan is a 23 y.o. G1P0 at [redacted]w[redacted]d presented for SOL  S: Breathing through contractions, nitrous no longer effective for pain control.  O:  BP (!) 111/52   Pulse 87   Temp 98 F (36.7 C) (Oral)   Resp 18   Ht 5\' 3"  (1.6 m)   Wt 73.3 kg   LMP 02/05/2023 (Within Days)   SpO2 100%   BMI 28.63 kg/m  EFM: 135/mod/+a/-d  CVE: Dilation: 7 Effacement (%): 90 Cervical Position: Posterior Station: -2 Presentation: Vertex Exam by:: Alondra Twitty RN   A&P: 23 y.o. G1P0 [redacted]w[redacted]d here for spontaneous onset of labor  #Labor: Progressing well. Offered AROM now that second dose of PCN in, however will defer for now, pt requesting IV pain meds, will allow rest after. #Pain: IV pain meds #FWB: Cat I #GBS positive  #GERD: Tums prn  Melanie Spires, MD 9:53 AM

## 2023-11-20 ENCOUNTER — Other Ambulatory Visit: Payer: Self-pay

## 2023-11-20 LAB — CBC
HCT: 28.5 % — ABNORMAL LOW (ref 36.0–46.0)
Hemoglobin: 9.2 g/dL — ABNORMAL LOW (ref 12.0–15.0)
MCH: 28.4 pg (ref 26.0–34.0)
MCHC: 32.3 g/dL (ref 30.0–36.0)
MCV: 88 fL (ref 80.0–100.0)
Platelets: 191 10*3/uL (ref 150–400)
RBC: 3.24 MIL/uL — ABNORMAL LOW (ref 3.87–5.11)
RDW: 14.5 % (ref 11.5–15.5)
WBC: 14.7 10*3/uL — ABNORMAL HIGH (ref 4.0–10.5)
nRBC: 0 % (ref 0.0–0.2)

## 2023-11-20 NOTE — Progress Notes (Signed)
 POSTPARTUM PROGRESS NOTE  Post Partum Day 1  Subjective:  Sandra Sullivan is a 23 y.o. G1P1001 s/p SVD at [redacted]w[redacted]d.  She reports she is doing well. No acute events overnight. She denies any problems with ambulating, voiding or po intake. Denies nausea or vomiting.  Pain is well controlled.  Lochia is normal.  Objective: Blood pressure 105/70, pulse 72, temperature 98.4 F (36.9 C), temperature source Oral, resp. rate 16, height 5\' 3"  (1.6 m), weight 73.3 kg, last menstrual period 02/05/2023, SpO2 100%, unknown if currently breastfeeding.  Physical Exam:  General: alert, cooperative and no distress Chest: no respiratory distress Heart:regular rate, distal pulses intact Uterine Fundus: firm, appropriately tender DVT Evaluation: No calf swelling or tenderness Extremities: trace edema Skin: warm, dry  Recent Labs    11/19/23 0517  HGB 11.0*  HCT 34.6*    Assessment/Plan: Sandra Sullivan is a 23 y.o. G1P1001 s/p SVD at [redacted]w[redacted]d   PPD#1 - Doing well  Routine postpartum care R femoral hernia   Palpated and no tenderness, feels to be at baseline, no concern for worsening / incarceration at this time Contraception: Patch Feeding: Breast milk Dispo: Plan for discharge on 05/21 or 05/22 if meeting all goals.   LOS: 1 day   Park Bolk, Medical Student 11/20/2023, 1:10 AM  Evaluation and management procedures were performed by the MS3 Lydia Sams under my supervision. I was immediately available for direct supervision, assistance and direction throughout this encounter.  I also confirm that I have verified the information documented in the student's note, and that I have also personally reperformed the pertinent components of the physical exam and all of the medical decision making activities.  I have also made any necessary editorial changes.   Candice Chalet, MD Family Medicine - Obstetrics Fellow

## 2023-11-20 NOTE — Plan of Care (Signed)
 Problem: Education: Goal: Knowledge of General Education information will improve Description: Including pain rating scale, medication(s)/side effects and non-pharmacologic comfort measures 11/20/2023 0752 by Osvaldo Blender, LPN Outcome: Progressing 11/20/2023 0752 by Osvaldo Blender, LPN Outcome: Progressing   Problem: Health Behavior/Discharge Planning: Goal: Ability to manage health-related needs will improve 11/20/2023 0752 by Osvaldo Blender, LPN Outcome: Progressing 11/20/2023 0752 by Osvaldo Blender, LPN Outcome: Progressing   Problem: Clinical Measurements: Goal: Ability to maintain clinical measurements within normal limits will improve 11/20/2023 0752 by Osvaldo Blender, LPN Outcome: Progressing 11/20/2023 0752 by Osvaldo Blender, LPN Outcome: Progressing Goal: Will remain free from infection 11/20/2023 0752 by Osvaldo Blender, LPN Outcome: Progressing 11/20/2023 0752 by Osvaldo Blender, LPN Outcome: Progressing Goal: Diagnostic test results will improve 11/20/2023 0752 by Osvaldo Blender, LPN Outcome: Progressing 11/20/2023 0752 by Osvaldo Blender, LPN Outcome: Progressing Goal: Respiratory complications will improve 11/20/2023 0752 by Osvaldo Blender, LPN Outcome: Progressing 11/20/2023 0752 by Osvaldo Blender, LPN Outcome: Progressing Goal: Cardiovascular complication will be avoided 11/20/2023 0752 by Osvaldo Blender, LPN Outcome: Progressing 11/20/2023 0752 by Osvaldo Blender, LPN Outcome: Progressing   Problem: Activity: Goal: Risk for activity intolerance will decrease 11/20/2023 0752 by Osvaldo Blender, LPN Outcome: Progressing 11/20/2023 0752 by Osvaldo Blender, LPN Outcome: Progressing   Problem: Nutrition: Goal: Adequate nutrition will be maintained 11/20/2023 0752 by Osvaldo Blender, LPN Outcome: Progressing 11/20/2023 0752 by Osvaldo Blender, LPN Outcome: Progressing   Problem: Coping: Goal: Level of anxiety will decrease 11/20/2023 0752 by Osvaldo Blender, LPN Outcome:  Progressing 11/20/2023 0752 by Osvaldo Blender, LPN Outcome: Progressing   Problem: Elimination: Goal: Will not experience complications related to bowel motility 11/20/2023 0752 by Osvaldo Blender, LPN Outcome: Progressing 11/20/2023 0752 by Osvaldo Blender, LPN Outcome: Progressing Goal: Will not experience complications related to urinary retention 11/20/2023 0752 by Osvaldo Blender, LPN Outcome: Progressing 11/20/2023 0752 by Osvaldo Blender, LPN Outcome: Progressing   Problem: Pain Managment: Goal: General experience of comfort will improve and/or be controlled 11/20/2023 0752 by Osvaldo Blender, LPN Outcome: Progressing 11/20/2023 0752 by Osvaldo Blender, LPN Outcome: Progressing   Problem: Safety: Goal: Ability to remain free from injury will improve 11/20/2023 0752 by Osvaldo Blender, LPN Outcome: Progressing 11/20/2023 0752 by Osvaldo Blender, LPN Outcome: Progressing   Problem: Skin Integrity: Goal: Risk for impaired skin integrity will decrease 11/20/2023 0752 by Osvaldo Blender, LPN Outcome: Progressing 11/20/2023 0752 by Osvaldo Blender, LPN Outcome: Progressing   Problem: Education: Goal: Knowledge of Childbirth will improve 11/20/2023 0752 by Osvaldo Blender, LPN Outcome: Progressing 11/20/2023 0752 by Osvaldo Blender, LPN Outcome: Progressing Goal: Ability to make informed decisions regarding treatment and plan of care will improve 11/20/2023 0752 by Osvaldo Blender, LPN Outcome: Progressing 11/20/2023 0752 by Osvaldo Blender, LPN Outcome: Progressing Goal: Ability to state and carry out methods to decrease the pain will improve 11/20/2023 0752 by Osvaldo Blender, LPN Outcome: Progressing 11/20/2023 0752 by Osvaldo Blender, LPN Outcome: Progressing Goal: Individualized Educational Video(s) 11/20/2023 0752 by Osvaldo Blender, LPN Outcome: Progressing 11/20/2023 0752 by Osvaldo Blender, LPN Outcome: Progressing   Problem: Coping: Goal: Ability to verbalize concerns and feelings  about labor and delivery will improve 11/20/2023 0752 by Osvaldo Blender, LPN Outcome: Progressing 11/20/2023 0752 by Osvaldo Blender, LPN Outcome: Progressing   Problem: Life Cycle: Goal: Ability to make normal progression  through stages of labor will improve 11/20/2023 0752 by Osvaldo Blender, LPN Outcome: Progressing 11/20/2023 0752 by Osvaldo Blender, LPN Outcome: Progressing Goal: Ability to effectively push during vaginal delivery will improve 11/20/2023 0752 by Osvaldo Blender, LPN Outcome: Progressing 11/20/2023 0752 by Osvaldo Blender, LPN Outcome: Progressing   Problem: Role Relationship: Goal: Will demonstrate positive interactions with the child 11/20/2023 0752 by Osvaldo Blender, LPN Outcome: Progressing 11/20/2023 0752 by Osvaldo Blender, LPN Outcome: Progressing   Problem: Safety: Goal: Risk of complications during labor and delivery will decrease 11/20/2023 0752 by Osvaldo Blender, LPN Outcome: Progressing 11/20/2023 0752 by Osvaldo Blender, LPN Outcome: Progressing   Problem: Pain Management: Goal: Relief or control of pain from uterine contractions will improve 11/20/2023 0752 by Osvaldo Blender, LPN Outcome: Progressing 11/20/2023 0752 by Osvaldo Blender, LPN Outcome: Progressing   Problem: Education: Goal: Knowledge of condition will improve 11/20/2023 0752 by Osvaldo Blender, LPN Outcome: Progressing 11/20/2023 0752 by Osvaldo Blender, LPN Outcome: Progressing Goal: Individualized Educational Video(s) 11/20/2023 0752 by Osvaldo Blender, LPN Outcome: Progressing 11/20/2023 0752 by Osvaldo Blender, LPN Outcome: Progressing Goal: Individualized Newborn Educational Video(s) 11/20/2023 0752 by Osvaldo Blender, LPN Outcome: Progressing 11/20/2023 0752 by Osvaldo Blender, LPN Outcome: Progressing   Problem: Activity: Goal: Will verbalize the importance of balancing activity with adequate rest periods 11/20/2023 0752 by Osvaldo Blender, LPN Outcome: Progressing 11/20/2023 0752 by  Osvaldo Blender, LPN Outcome: Progressing Goal: Ability to tolerate increased activity will improve 11/20/2023 0752 by Osvaldo Blender, LPN Outcome: Progressing 11/20/2023 0752 by Osvaldo Blender, LPN Outcome: Progressing   Problem: Coping: Goal: Ability to identify and utilize available resources and services will improve 11/20/2023 0752 by Osvaldo Blender, LPN Outcome: Progressing 11/20/2023 0752 by Osvaldo Blender, LPN Outcome: Progressing   Problem: Life Cycle: Goal: Chance of risk for complications during the postpartum period will decrease 11/20/2023 0752 by Osvaldo Blender, LPN Outcome: Progressing 11/20/2023 0752 by Osvaldo Blender, LPN Outcome: Progressing   Problem: Role Relationship: Goal: Ability to demonstrate positive interaction with newborn will improve 11/20/2023 0752 by Osvaldo Blender, LPN Outcome: Progressing 11/20/2023 0752 by Osvaldo Blender, LPN Outcome: Progressing   Problem: Skin Integrity: Goal: Demonstration of wound healing without infection will improve 11/20/2023 0752 by Osvaldo Blender, LPN Outcome: Progressing 11/20/2023 0752 by Osvaldo Blender, LPN Outcome: Progressing

## 2023-11-20 NOTE — Lactation Note (Addendum)
 This note was copied from a baby's chart. Lactation Consultation Note  Patient Name: [name] Date: 11/20/2023 Age:21 hours Reason for consult: Initial assessment;Primapara;1st time breastfeeding;Early term 37-38.6wks  P1, 38 wks, @ 12 hrs of life. Infants bassinet wet with LC arrival, praised wet diaper- changed linens. Mom worried her colostrum is so small, has been hand expressing for him. Encouraged mom- colostrum is small to match babies small stomach (spoonful) and small energy on day 1. Hand expression is a great way to start, and breast compression through feed keeps baby working. Demonstrated big mouth latching several times with mom- encouraged babies biggest task is to smell and open big, hers is to move baby deeply onto breast. Infant feeds well on right breast for 15 minutes. Hand pump provided to mom.   Maternal Data Has patient been taught Hand Expression?: Yes  Feeding Mother's Current Feeding Choice: Breast Milk  LATCH Score Latch: Grasps breast easily, tongue down, lips flanged, rhythmical sucking.  Audible Swallowing: Spontaneous and intermittent  Type of Nipple: Everted at rest and after stimulation  Comfort (Breast/Nipple): Soft / non-tender  Hold (Positioning): Assistance needed to correctly position infant at breast and maintain latch. (Mom takes over holds)  [facility] Score: 9   Lactation Tools Discussed/Used Tools: Pump Breast pump type: Manual Pump Education: Milk Storage  Interventions Interventions: Breast feeding basics reviewed;Assisted with latch;Hand express;Breast compression;Support pillows;Adjust position;Hand pump;Education;LC Services brochure;CDC milk storage guidelines  Discharge Pump: Manual;Personal (Provided hand pump) WIC Program: Yes  Consult Status Consult Status: Follow-up Date: 11/21/23 Follow-up type: In-patient    [facility] 11/20/2023, 10:38 AM

## 2023-11-20 NOTE — Lactation Note (Signed)
 This note was copied from a baby's chart. Lactation Consultation Note  Patient Name: Sandra Sullivan ONGEX'B Date: 11/20/2023 Age:21 hours Reason for consult: Mother's request;Early term 37-38.6wks;1st time breastfeeding;Follow-up assessment;Infant weight loss (weight loss -1.39%) Per MOB, infant breastfeed for 15 minutes prior to [facility] entering the room, infant was fussy and cuing, MOB attempted latch infant but infant came off the breast. MOB hand expressed 9 mls of colostrum infant appeared calmer and latched again on MOB left breast with pillow support, sustained his latch and was still breastfeed after 5 minutes when LC left the room. MOB knows infant is cluster feeding on day 2 of life. MOB knows if infant is still cuing after latching on the first breast to offer the 2nd breast during the same feeding. LC discussed early signs of hunger and and signs of satiety in infant. MOB knows to call for further latch assistance if needed.   Maternal Data    Feeding    LATCH Score Latch: Grasps breast easily, tongue down, lips flanged, rhythmical sucking.  Audible Swallowing: A few with stimulation  Type of Nipple: Everted at rest and after stimulation  Comfort (Breast/Nipple): Soft / non-tender  Hold (Positioning): Assistance needed to correctly position infant at breast and maintain latch.  LATCH Score: 8   Lactation Tools Discussed/Used    Interventions Interventions: Skin to skin;Assisted with latch;Adjust position;Support pillows;Position options;Breast compression;Expressed milk;Hand express;Education  Discharge    Consult Status Consult Status: Follow-up Date: 11/21/23 Follow-up type: In-patient    [name] 11/20/2023, 9:52 PM

## 2023-11-20 NOTE — Patient Instructions (Signed)
 Your appointment with Outpatient Lactation is: Date: December 05, 2023 Time:10:00 am  [facility] (First Floor) [address] [place]  Check in under baby's name.  Please bring your baby hungry along with your pump and a bottle of either formula or expressed breast milk. Please also bring your pump flanges and we welcome support people! If you need lactation assistance before your appointment, please call [phone] and press 4 for lactation.

## 2023-11-20 NOTE — Anesthesia Postprocedure Evaluation (Signed)
 Anesthesia Post Note  Patient: Sandra Sullivan  Procedure(s) Performed: AN AD HOC LABOR EPIDURAL     Patient location during evaluation: Mother Baby Anesthesia Type: Epidural Level of consciousness: awake, oriented and awake and alert Pain management: pain level controlled Vital Signs Assessment: post-procedure vital signs reviewed and stable Respiratory status: spontaneous breathing, nonlabored ventilation and respiratory function stable Cardiovascular status: stable Postop Assessment: no headache, patient able to bend at knees, adequate PO intake, able to ambulate and no apparent nausea or vomiting Anesthetic complications: no   No notable events documented.  Last Vitals:  Vitals:   11/20/23 0112 11/20/23 0500  BP: 112/63 123/78  Pulse: 71 85  Resp: 16 16  Temp: 36.9 C 36.9 C  SpO2: 100% 100%    Last Pain:  Vitals:   11/20/23 0612  TempSrc:   PainSc: 0-No pain   Pain Goal:                   [name]

## 2023-11-21 ENCOUNTER — Ambulatory Visit (HOSPITAL_COMMUNITY): Payer: Self-pay

## 2023-11-21 LAB — BIRTH TISSUE RECOVERY COLLECTION (PLACENTA DONATION)

## 2023-11-21 MED ORDER — IBUPROFEN 600 MG PO TABS: 600.0000 mg | ORAL_TABLET | Freq: Four times a day (QID) | ORAL | 0 refills | Status: DC | PRN

## 2023-11-21 NOTE — Plan of Care (Signed)

## 2023-11-21 NOTE — Discharge Instructions (Signed)

## 2023-11-21 NOTE — Plan of Care (Signed)
 Problem: Education: Goal: Knowledge of General Education information will improve Description: Including pain rating scale, medication(s)/side effects and non-pharmacologic comfort measures 11/21/2023 1108 by Osvaldo Blender, LPN Outcome: Adequate for Discharge 11/21/2023 1108 by Osvaldo Blender, LPN Outcome: Progressing 11/21/2023 0824 by Osvaldo Blender, LPN Outcome: Progressing   Problem: Health Behavior/Discharge Planning: Goal: Ability to manage health-related needs will improve 11/21/2023 1108 by Osvaldo Blender, LPN Outcome: Adequate for Discharge 11/21/2023 1108 by Osvaldo Blender, LPN Outcome: Progressing 11/21/2023 0824 by Osvaldo Blender, LPN Outcome: Progressing   Problem: Clinical Measurements: Goal: Ability to maintain clinical measurements within normal limits will improve 11/21/2023 1108 by Osvaldo Blender, LPN Outcome: Adequate for Discharge 11/21/2023 1108 by Osvaldo Blender, LPN Outcome: Progressing 11/21/2023 0824 by Osvaldo Blender, LPN Outcome: Progressing Goal: Will remain free from infection 11/21/2023 1108 by Osvaldo Blender, LPN Outcome: Adequate for Discharge 11/21/2023 1108 by Osvaldo Blender, LPN Outcome: Progressing 11/21/2023 0824 by Osvaldo Blender, LPN Outcome: Progressing Goal: Diagnostic test results will improve 11/21/2023 1108 by Osvaldo Blender, LPN Outcome: Adequate for Discharge 11/21/2023 1108 by Osvaldo Blender, LPN Outcome: Progressing 11/21/2023 0824 by Osvaldo Blender, LPN Outcome: Progressing Goal: Respiratory complications will improve 11/21/2023 1108 by Osvaldo Blender, LPN Outcome: Adequate for Discharge 11/21/2023 1108 by Osvaldo Blender, LPN Outcome: Progressing 11/21/2023 0824 by Osvaldo Blender, LPN Outcome: Progressing Goal: Cardiovascular complication will be avoided 11/21/2023 1108 by Osvaldo Blender, LPN Outcome: Adequate for Discharge 11/21/2023 1108 by Osvaldo Blender, LPN Outcome: Progressing 11/21/2023 0824 by Osvaldo Blender, LPN Outcome:  Progressing   Problem: Activity: Goal: Risk for activity intolerance will decrease 11/21/2023 1108 by Osvaldo Blender, LPN Outcome: Adequate for Discharge 11/21/2023 1108 by Osvaldo Blender, LPN Outcome: Progressing 11/21/2023 0824 by Osvaldo Blender, LPN Outcome: Progressing   Problem: Nutrition: Goal: Adequate nutrition will be maintained 11/21/2023 1108 by Osvaldo Blender, LPN Outcome: Adequate for Discharge 11/21/2023 1108 by Osvaldo Blender, LPN Outcome: Progressing 11/21/2023 0824 by Osvaldo Blender, LPN Outcome: Progressing   Problem: Coping: Goal: Level of anxiety will decrease 11/21/2023 1108 by Osvaldo Blender, LPN Outcome: Adequate for Discharge 11/21/2023 1108 by Osvaldo Blender, LPN Outcome: Progressing 11/21/2023 0824 by Osvaldo Blender, LPN Outcome: Progressing   Problem: Elimination: Goal: Will not experience complications related to bowel motility 11/21/2023 1108 by Osvaldo Blender, LPN Outcome: Adequate for Discharge 11/21/2023 1108 by Osvaldo Blender, LPN Outcome: Progressing 11/21/2023 0824 by Osvaldo Blender, LPN Outcome: Progressing Goal: Will not experience complications related to urinary retention 11/21/2023 1108 by Osvaldo Blender, LPN Outcome: Adequate for Discharge 11/21/2023 1108 by Osvaldo Blender, LPN Outcome: Progressing 11/21/2023 0824 by Osvaldo Blender, LPN Outcome: Progressing   Problem: Pain Managment: Goal: General experience of comfort will improve and/or be controlled 11/21/2023 1108 by Osvaldo Blender, LPN Outcome: Adequate for Discharge 11/21/2023 1108 by Osvaldo Blender, LPN Outcome: Progressing 11/21/2023 0824 by Osvaldo Blender, LPN Outcome: Progressing   Problem: Safety: Goal: Ability to remain free from injury will improve 11/21/2023 1108 by Osvaldo Blender, LPN Outcome: Adequate for Discharge 11/21/2023 1108 by Osvaldo Blender, LPN Outcome: Progressing 11/21/2023 0824 by Osvaldo Blender, LPN Outcome: Progressing   Problem: Skin Integrity: Goal:  Risk for impaired skin integrity will decrease 11/21/2023 1108 by Osvaldo Blender, LPN Outcome: Adequate for Discharge 11/21/2023 1108 by Osvaldo Blender, LPN Outcome: Progressing 11/21/2023 0824 by Allyn Ivy  Rhesa Celeste, LPN Outcome: Progressing   Problem: Education: Goal: Knowledge of Childbirth will improve 11/21/2023 1108 by Osvaldo Blender, LPN Outcome: Adequate for Discharge 11/21/2023 1108 by Osvaldo Blender, LPN Outcome: Progressing 11/21/2023 0824 by Osvaldo Blender, LPN Outcome: Progressing Goal: Ability to make informed decisions regarding treatment and plan of care will improve 11/21/2023 1108 by Osvaldo Blender, LPN Outcome: Adequate for Discharge 11/21/2023 1108 by Osvaldo Blender, LPN Outcome: Progressing 11/21/2023 0824 by Osvaldo Blender, LPN Outcome: Progressing Goal: Ability to state and carry out methods to decrease the pain will improve 11/21/2023 1108 by Osvaldo Blender, LPN Outcome: Adequate for Discharge 11/21/2023 1108 by Osvaldo Blender, LPN Outcome: Progressing 11/21/2023 0824 by Osvaldo Blender, LPN Outcome: Progressing Goal: Individualized Educational Video(s) 11/21/2023 1108 by Osvaldo Blender, LPN Outcome: Adequate for Discharge 11/21/2023 1108 by Osvaldo Blender, LPN Outcome: Progressing 11/21/2023 0824 by Osvaldo Blender, LPN Outcome: Progressing   Problem: Coping: Goal: Ability to verbalize concerns and feelings about labor and delivery will improve 11/21/2023 1108 by Osvaldo Blender, LPN Outcome: Adequate for Discharge 11/21/2023 1108 by Osvaldo Blender, LPN Outcome: Progressing 11/21/2023 0824 by Osvaldo Blender, LPN Outcome: Progressing   Problem: Life Cycle: Goal: Ability to make normal progression through stages of labor will improve 11/21/2023 1108 by Osvaldo Blender, LPN Outcome: Adequate for Discharge 11/21/2023 1108 by Osvaldo Blender, LPN Outcome: Progressing 11/21/2023 0824 by Osvaldo Blender, LPN Outcome: Progressing Goal: Ability to effectively push during  vaginal delivery will improve 11/21/2023 1108 by Osvaldo Blender, LPN Outcome: Adequate for Discharge 11/21/2023 1108 by Osvaldo Blender, LPN Outcome: Progressing 11/21/2023 0824 by Osvaldo Blender, LPN Outcome: Progressing   Problem: Role Relationship: Goal: Will demonstrate positive interactions with the child 11/21/2023 1108 by Osvaldo Blender, LPN Outcome: Adequate for Discharge 11/21/2023 1108 by Osvaldo Blender, LPN Outcome: Progressing 11/21/2023 0824 by Osvaldo Blender, LPN Outcome: Progressing   Problem: Safety: Goal: Risk of complications during labor and delivery will decrease 11/21/2023 1108 by Osvaldo Blender, LPN Outcome: Adequate for Discharge 11/21/2023 1108 by Osvaldo Blender, LPN Outcome: Progressing 11/21/2023 0824 by Osvaldo Blender, LPN Outcome: Progressing   Problem: Pain Management: Goal: Relief or control of pain from uterine contractions will improve 11/21/2023 1108 by Osvaldo Blender, LPN Outcome: Adequate for Discharge 11/21/2023 1108 by Osvaldo Blender, LPN Outcome: Progressing 11/21/2023 0824 by Osvaldo Blender, LPN Outcome: Progressing   Problem: Education: Goal: Knowledge of condition will improve 11/21/2023 1108 by Osvaldo Blender, LPN Outcome: Adequate for Discharge 11/21/2023 1108 by Osvaldo Blender, LPN Outcome: Progressing 11/21/2023 0824 by Osvaldo Blender, LPN Outcome: Progressing Goal: Individualized Educational Video(s) 11/21/2023 1108 by Osvaldo Blender, LPN Outcome: Adequate for Discharge 11/21/2023 1108 by Osvaldo Blender, LPN Outcome: Progressing 11/21/2023 0824 by Osvaldo Blender, LPN Outcome: Progressing Goal: Individualized Newborn Educational Video(s) 11/21/2023 1108 by Osvaldo Blender, LPN Outcome: Adequate for Discharge 11/21/2023 1108 by Osvaldo Blender, LPN Outcome: Progressing 11/21/2023 0824 by Osvaldo Blender, LPN Outcome: Progressing   Problem: Activity: Goal: Will verbalize the importance of balancing activity with adequate rest  periods 11/21/2023 1108 by Osvaldo Blender, LPN Outcome: Adequate for Discharge 11/21/2023 1108 by Osvaldo Blender, LPN Outcome: Progressing 11/21/2023 0824 by Osvaldo Blender, LPN Outcome: Progressing Goal: Ability to tolerate increased activity will improve 11/21/2023 1108 by Osvaldo Blender, LPN Outcome: Adequate for Discharge 11/21/2023 1108 by Allyn Ivy,  Rhesa Celeste, LPN Outcome: Progressing 11/21/2023 0824 by Osvaldo Blender, LPN Outcome: Progressing   Problem: Coping: Goal: Ability to identify and utilize available resources and services will improve 11/21/2023 1108 by Osvaldo Blender, LPN Outcome: Adequate for Discharge 11/21/2023 1108 by Osvaldo Blender, LPN Outcome: Progressing 11/21/2023 0824 by Osvaldo Blender, LPN Outcome: Progressing   Problem: Life Cycle: Goal: Chance of risk for complications during the postpartum period will decrease 11/21/2023 1108 by Osvaldo Blender, LPN Outcome: Adequate for Discharge 11/21/2023 1108 by Osvaldo Blender, LPN Outcome: Progressing 11/21/2023 0824 by Osvaldo Blender, LPN Outcome: Progressing   Problem: Role Relationship: Goal: Ability to demonstrate positive interaction with newborn will improve 11/21/2023 1108 by Osvaldo Blender, LPN Outcome: Adequate for Discharge 11/21/2023 1108 by Osvaldo Blender, LPN Outcome: Progressing 11/21/2023 0824 by Osvaldo Blender, LPN Outcome: Progressing   Problem: Skin Integrity: Goal: Demonstration of wound healing without infection will improve 11/21/2023 1108 by Osvaldo Blender, LPN Outcome: Adequate for Discharge 11/21/2023 1108 by Osvaldo Blender, LPN Outcome: Progressing 11/21/2023 0824 by Osvaldo Blender, LPN Outcome: Progressing

## 2023-11-21 NOTE — Progress Notes (Signed)
 CSW received a consult for "MOB not having family support"; and met MOB at bedside to offer support. CSW entered the room, introduced herself and explained the reason for the visit. MOB presented laying in the bed while the infant was away being circumcised in the nursery. MOB presented as calm, was agreeable to consult and remained engaged throughout encounter.  CSW asked MOB about her family support. MOB reported she has her mom and grandmother; however she doesn't have anyone that can and stay with her. MOB reported they both will be visiting often; but unsure how often due to her recent move to [facility] and they both work.  CSW asked MOB about her mental health history. MOB reported being diagnosed with ADHD during her childhood and was participating in therapy for support. MOB reported once she begin middle school she discontinued her therapy sessions and she declined being prescribed medication. MOB reported since delivering the infant she has felt anxious and fearful of going home alone with the infant due to her being tired. CSW validated MOB feelings understood how the change could bring on feelings of nervousness.  CSW encouraged MOB to sleep when the infant sleeping, set alarms to remember things to complete for the infant and reachout  to her family, PCP/OB if the adjustment  period becomes overwhelming.  CSW provided with a therapy list and a list of support groups for additional support during  her PP period. CSW provided education regarding the baby blues period vs. perinatal mood disorders, discussed treatment and gave resources for mental health follow up if concerns arise.  CSW recommends self-evaluation during the postpartum time period using the New Mom Checklist from Postpartum Progress and encouraged MOB to contact a medical professional if symptoms are noted at any time. CSW assessed for safety with MOB SI/HI/DV;MOB denied all.  CSW identifies no further need for intervention and  no barriers to discharge at this time.  Jenney Modest, Milinda Allen Clinical Social Worker [phone]

## 2023-11-21 NOTE — Lactation Note (Signed)
 This note was copied from a baby's chart. Lactation Consultation Note  Patient Name: Sandra Sullivan [id number] Date: 11/21/2023 Age:21 hours Reason for consult: Follow-up assessment;1st time breastfeeding;Early term 37-38.6wks;Infant weight loss (Infant had change in weight -1.39 to -4.94% in past 48 hours.) Per MOB, she feels breast feeding is going well, most feedings today are 20 to 30 minutes in length, MOB is not longer having pain or pinching when infant breastfeeds. MOB is starting to feel breast are filling but not engorged. Infant is latching well today. Infant had 4 voids and 3 stools and has voided since his circumcision. Per MOB, few times after latching infant at the breast, she has hand expressed and given him extra EBM by spoon. MOB knows to call if she has questions, concerns or need further latch assistance. RN will give MOB coconut oil for breast soreness, MOB does not have any nipple trauma or tissue breakdown.   Maternal Data    Feeding Mother's Current Feeding Choice: Breast Milk  LATCH Score  LC did not observe latch due infant coming off the breast as LC was entering MOB's room.                   Lactation Tools Discussed/Used    Interventions Interventions: Position options;Expressed milk;Hand express;Breast compression;Pace feeding;Coconut oil  Discharge    Consult Status Consult Status: Follow-up Date: 11/22/23 Follow-up type: In-patient    [name] 11/21/2023, 10:00 PM

## 2023-11-22 ENCOUNTER — Ambulatory Visit (HOSPITAL_COMMUNITY): Payer: Self-pay

## 2023-11-22 ENCOUNTER — Encounter: Admitting: Family Medicine

## 2023-11-22 NOTE — Lactation Note (Signed)
 This note was copied from a baby's chart. Lactation Consultation Note  Patient Name: Sandra Sullivan ZOXWR'U Date: 11/22/2023 Age:21 hours Reason for consult: Follow-up assessment;Maternal discharge;Early term 3-38.6wks  Mom anticipates DC today. Mom has several ounce bottles of pumped breast milk. Praised mom- fantastic production. Discussed pumping for several let downs, to breast softens. Discussed nipple care with breast feeding or pumping. Encouraged mom to keep working on big mouth latch with baby and use EBM or coconut oil after each feed. Discussed cluster feeding overnight/ early morning brings in our milk supply, shared expectations of milk coming in. Highlighted risk of engorgement. Discussed hand pump/express to soften breasts, motrin  as anti-inflammatory, and ice packs for 10-20 minutes post feed/pumping if still over-full is the best treatments for inflamed/engorged breasts.  Maternal Data Has patient been taught Hand Expression?: Yes Does the patient have breastfeeding experience prior to this delivery?: No  Feeding Mother's Current Feeding Choice: Breast Milk    Lactation Tools Discussed/Used Breast pump type: Double-Electric Breast Pump Pump Education: Milk Storage  Interventions Interventions: Hand express;Breast compression;Expressed milk;Coconut oil;Hand pump;DEBP;Education  Discharge Discharge Education: Engorgement and breast care Pump: Manual;Personal  Consult Status Consult Status: Complete Date: 11/22/23 Follow-up type: In-patient    [facility] 11/22/2023, 11:48 AM

## 2023-11-27 ENCOUNTER — Encounter: Payer: Self-pay | Admitting: Obstetrics & Gynecology

## 2023-11-27 ENCOUNTER — Encounter: Payer: Self-pay | Admitting: *Deleted

## 2023-11-29 ENCOUNTER — Telehealth (HOSPITAL_COMMUNITY): Payer: Self-pay | Admitting: *Deleted

## 2023-11-29 ENCOUNTER — Inpatient Hospital Stay (HOSPITAL_COMMUNITY): Admit: 2023-11-29 | Payer: Medicaid Other

## 2023-11-29 NOTE — Telephone Encounter (Signed)
 11/29/2023  Name: Sandra Sullivan MRN: [id number] DOB: [birth year]  Reason for Call:  Transition of Care Hospital Discharge Call  Contact Status: Patient Contact Status: Complete  Language assistant needed: Interpreter Mode: Interpreter Not Needed        Follow-Up Questions: Do You Have Any Concerns About Your Health As You Heal From Delivery?: No Do You Have Any Concerns About Your Infants Health?: No  Edinburgh Postnatal Depression Scale:  In the Past 7 Days: I have been able to laugh and see the funny side of things.: As much as I always could I have looked forward with enjoyment to things.: As much as I ever did I have blamed myself unnecessarily when things went wrong.: Not very often I have been anxious or worried for no good reason.: Hardly ever I have felt scared or panicky for no good reason.: No, not much Things have been getting on top of me.: No, most of the time I have coped quite well I have been so unhappy that I have had difficulty sleeping.: Not at all I have felt sad or miserable.: Not very often I have been so unhappy that I have been crying.: Only occasionally The thought of harming myself has occurred to me.: Never Edinburgh Postnatal Depression Scale Total: 6  PHQ2-9 Depression Scale:     Discharge Follow-up: Edinburgh score requires follow up?: No Patient was advised of the following resources:: Support Group, Breastfeeding Support Group  Post-discharge interventions: Reviewed Newborn Safe Sleep Practices  Pearlie Bougie, RN 11/29/2023 12:15

## 2023-12-17 ENCOUNTER — Other Ambulatory Visit: Payer: Self-pay

## 2023-12-17 DIAGNOSIS — K4090 Unilateral inguinal hernia, without obstruction or gangrene, not specified as recurrent: Secondary | ICD-10-CM

## 2024-01-01 ENCOUNTER — Ambulatory Visit (INDEPENDENT_AMBULATORY_CARE_PROVIDER_SITE_OTHER): Admitting: Advanced Practice Midwife

## 2024-01-01 ENCOUNTER — Encounter: Payer: Self-pay | Admitting: Advanced Practice Midwife

## 2024-01-01 VITALS — BP 115/73 | HR 57 | Wt 141.8 lb

## 2024-01-01 DIAGNOSIS — Z3009 Encounter for other general counseling and advice on contraception: Secondary | ICD-10-CM

## 2024-01-01 NOTE — Progress Notes (Signed)
 Post Partum Visit Note  Sandra Sullivan is a 23 y.o. G8P1001 female who presents for a postpartum visit. She is 6 weeks postpartum following a normal spontaneous vaginal delivery.  I have fully reviewed the prenatal and intrapartum course. The delivery was at [redacted]w[redacted]d gestational weeks.  Anesthesia: epidural. Postpartum course has been well. Baby is doing well. Baby is feeding by breast. Bleeding period. Bowel function is normal. Bladder function is normal. Patient is not sexually active. Contraception method is wants to discuss. Postpartum depression screening: negative.   The pregnancy intention screening data noted above was reviewed. Potential methods of contraception were discussed. The patient elected to proceed with No data recorded.    Health Maintenance Due  Topic Date Due   HPV VACCINES (1 - 3-dose series) Never done   Meningococcal B Vaccine (1 of 2 - Standard) Never done   DTaP/Tdap/Td (1 - Tdap) Never done   Hepatitis B Vaccines (1 of 3 - 19+ 3-dose series) Never done   COVID-19 Vaccine (1 - 2024-25 season) Never done    The following portions of the patient's history were reviewed and updated as appropriate: allergies, current medications, past family history, past medical history, past social history, past surgical history, and problem list.  Review of Systems Pertinent items are noted in HPI.  Objective:  BP 115/73   Pulse (!) 57   Wt 141 lb 12.8 oz (64.3 kg)   LMP 02/05/2023 (Within Days)   Breastfeeding Yes   BMI 25.12 kg/m     General:  Alert, cooperative, NAD   Breasts:  not indicated  Lungs: Normal rate and effort  Heart:  Normal rate  Abdomen: Soft, NT, Fundus non-palpable  Wound NA  GU exam:  Laceration healing well. No drainage, bleeding, erythema or swelling.    Assessment:   1. Birth control counseling (Primary)  2. Postpartum exam    Plan:   Essential components of care per ACOG recommendations:  1.  Mood and well being: Patient  with negative depression screening today. Reviewed local resources for support.  - Patient tobacco use? No.   - hx of drug use? No.    2. Infant care and feeding:  -Patient currently breastmilk feeding? Yes. Discussed returning to work and pumping. Reviewed importance of draining breast regularly to support lactation. Patient needs a work note. Patient was provided letter for work to allow for every 2-3 hr pumping breaks, and to be granted a private location to express breastmilk and refrigerated area to store breastmilk.  -Social determinants of health (SDOH) reviewed in EPIC. No concerns. The following needs were identified.   3. Sexuality, contraception and birth spacing - Patient does not want a pregnancy in the next year.  Desired family size is 1 children.  - Reviewed reproductive life planning. Reviewed contraceptive methods based on pt preferences and effectiveness.  Patient considering IUD or IUS. Will call to  schedule if desired.  - Discussed birth spacing of 18 months  4. Sleep and fatigue -Encouraged family/partner/community support of 4 hrs of uninterrupted sleep to help with mood and fatigue  5. Physical Recovery  - Discussed patients delivery and complications. She describes her labor as good. - Patient had a Vaginal, no problems at delivery. Patient had a 1st degree laceration. Perineal healing reviewed. Patient expressed understanding - Patient has urinary incontinence? No. - Patient is safe to resume physical and sexual activity  6.  Health Maintenance - HM due items addressed No - NA - Last  pap smear NA.  Pap smear not done at today's visit due to age.  -Breast Cancer screening indicated? No.   7. Chronic Disease/Pregnancy Condition follow up: Hernia F/U scheduled in 6 months.   - PCP follow up  Annelies Coyt  Claudene, [facility] for [employer], [facility]

## 2024-02-06 ENCOUNTER — Encounter: Payer: Self-pay | Admitting: *Deleted

## 2024-02-06 ENCOUNTER — Emergency Department

## 2024-02-06 ENCOUNTER — Other Ambulatory Visit: Payer: Self-pay

## 2024-02-06 ENCOUNTER — Emergency Department
Admission: EM | Admit: 2024-02-06 | Discharge: 2024-02-06 | Disposition: A | Discharge status: Home / Self Care | Attending: Emergency Medicine | Admitting: Emergency Medicine

## 2024-02-06 DIAGNOSIS — M25552 Pain in left hip: Secondary | ICD-10-CM | POA: Diagnosis present

## 2024-02-06 DIAGNOSIS — Y92410 Unspecified street and highway as the place of occurrence of the external cause: Secondary | ICD-10-CM | POA: Diagnosis not present

## 2024-02-06 DIAGNOSIS — R079 Chest pain, unspecified: Secondary | ICD-10-CM | POA: Insufficient documentation

## 2024-02-06 LAB — POC URINE PREG, ED: Preg Test, Ur: NEGATIVE

## 2024-02-06 MED ORDER — ACETAMINOPHEN 325 MG PO TABS
650.0000 mg | ORAL_TABLET | Freq: Once | ORAL | Status: AC
  Administered 2024-02-06: 650 mg via ORAL
  Filled 2024-02-06: qty 2

## 2024-02-06 NOTE — Discharge Instructions (Signed)
 The chest x-ray and left hip x-ray were normal today, no fractures.  You will likely be more sore tomorrow than you are today, this is normal. After this your pain should slowly be improving. If not, you need to be seen by another health care provider. This could be your PCP, urgent care or in the emergency department. Return to the emergency department specifically, if you have development of chest pain, shortness of breath, abdominal pain, new abdominal bruising or any other symptom personally concerning to you.  You can take 650 mg of Tylenol  and 600 mg of ibuprofen  every 6 hours as needed for pain. You can use ice, heat, muscle creams and other topical pain relievers as well.

## 2024-02-06 NOTE — ED Notes (Signed)
 IV removed at this time from pt L AC. Catheter fully intact. Pt given blue paper scrubs to change into.
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
## 2024-02-13 ENCOUNTER — Ambulatory Visit: Admitting: Family Medicine

## 2024-02-17 ENCOUNTER — Telehealth: Payer: Self-pay

## 2024-02-17 NOTE — Telephone Encounter (Signed)
 Called and spoke with the patient and let her know that she was due for a follow up with Dr Marinda with an ultrasound for assessment of her hernia first. I let her know that the order was in for her ultrasound and that she could call to schedule this herself. She said that she would call to schedule this and then call us  to schedule her follow up with Dr Marinda.

## 2024-02-27 ENCOUNTER — Encounter: Payer: Self-pay | Admitting: Obstetrics & Gynecology

## 2024-02-27 ENCOUNTER — Ambulatory Visit: Admitting: Obstetrics & Gynecology

## 2024-02-27 ENCOUNTER — Other Ambulatory Visit (HOSPITAL_COMMUNITY)
Admission: RE | Admit: 2024-02-27 | Discharge: 2024-02-27 | Disposition: A | Discharge status: Home / Self Care | Source: Ambulatory Visit | Attending: Obstetrics & Gynecology | Admitting: Obstetrics & Gynecology

## 2024-02-27 VITALS — BP 103/64 | HR 81 | Wt 131.0 lb

## 2024-02-27 DIAGNOSIS — Z124 Encounter for screening for malignant neoplasm of cervix: Secondary | ICD-10-CM | POA: Diagnosis not present

## 2024-02-27 DIAGNOSIS — R102 Pelvic and perineal pain: Secondary | ICD-10-CM

## 2024-02-27 MED ORDER — IBUPROFEN 800 MG PO TABS: 800.0000 mg | ORAL_TABLET | Freq: Three times a day (TID) | ORAL | 3 refills | Status: AC | PRN
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
## 2024-03-09 ENCOUNTER — Ambulatory Visit: Payer: Self-pay | Admitting: Obstetrics & Gynecology

## 2024-03-09 LAB — CYTOLOGY - PAP
Chlamydia: NEGATIVE
Comment: NEGATIVE
Comment: NEGATIVE
Comment: NEGATIVE
Comment: NORMAL
Diagnosis: NEGATIVE
Diagnosis: REACTIVE
High risk HPV: NEGATIVE
Neisseria Gonorrhea: NEGATIVE
Trichomonas: NEGATIVE
